# Patient Record
Sex: Male | Born: 1950
Health system: Southern US, Community
[De-identification: ages and names within clinical notes are randomized; demographics above are authoritative.]

## PROBLEM LIST (undated history)

## (undated) DIAGNOSIS — K4090 Unilateral inguinal hernia, without obstruction or gangrene, not specified as recurrent: Principal | ICD-10-CM

## (undated) DIAGNOSIS — G4730 Sleep apnea, unspecified: Secondary | ICD-10-CM

## (undated) DIAGNOSIS — I639 Cerebral infarction, unspecified: Secondary | ICD-10-CM

## (undated) DIAGNOSIS — K8689 Other specified diseases of pancreas: Secondary | ICD-10-CM

## (undated) DIAGNOSIS — C801 Malignant (primary) neoplasm, unspecified: Secondary | ICD-10-CM

## (undated) HISTORY — DX: Other specified diseases of pancreas: K86.89

## (undated) HISTORY — PX: TONSILLECTOMY: SUR1361

## (undated) HISTORY — DX: Unilateral inguinal hernia, without obstruction or gangrene, not specified as recurrent: K40.90

## (undated) HISTORY — PX: HERNIA REPAIR: SHX51

## (undated) HISTORY — PX: HAND SURGERY: SHX662

## (undated) HISTORY — PX: BRAIN SURGERY: SHX531

## (undated) HISTORY — DX: Cerebral infarction, unspecified: I63.9

---

## 2006-03-24 ENCOUNTER — Ambulatory Visit: Payer: Self-pay | Admitting: Unknown Physician Specialty

## 2012-07-16 ENCOUNTER — Encounter (INDEPENDENT_AMBULATORY_CARE_PROVIDER_SITE_OTHER): Payer: Self-pay | Admitting: Surgery

## 2012-07-16 ENCOUNTER — Ambulatory Visit (INDEPENDENT_AMBULATORY_CARE_PROVIDER_SITE_OTHER): Payer: 59 | Admitting: Surgery

## 2012-07-16 VITALS — BP 160/88 | HR 61 | Temp 98.0°F | Ht 74.5 in | Wt 169.4 lb

## 2012-07-16 DIAGNOSIS — K4090 Unilateral inguinal hernia, without obstruction or gangrene, not specified as recurrent: Secondary | ICD-10-CM | POA: Insufficient documentation

## 2012-07-16 HISTORY — DX: Unilateral inguinal hernia, without obstruction or gangrene, not specified as recurrent: K40.90

## 2012-07-16 NOTE — Progress Notes (Signed)
NAME: Jeffrey Carr DOB: 24-Dec-1950 MRN: [id number]                                                                                      DATE: 07/16/2012  PCP: No primary provider on file. Referring Provider: No ref. provider found  IMPRESSION: Left inguinal hernia, reducible, direct likely. PLAN:   I discussed alternatives with the patient and recommended that he consider repair. We discussed alternatives for repair and the use of mesh. He is interested in trying a truss as he is currently using a girdle. I told her that would be okay but I didn't think that was going to do anything other than delay his eventual surgery.                 CC:  Chief Complaint  Patient presents with  . Inguinal Hernia    Left    HPI:  Jeffrey Carr is a 61 y.o.  male who presents for evaluation of A left inguinal hernia. He's noticed it several weeks ago after catching himself when he was falling. Is not currently symptomatic in terms of pain. The bulge appears When he stands up and goes away when he lies down. He's never had a hernia before.  PMH:  has a past medical history of Left inguinal hernia (07/16/2012).  PSH:   has past surgical history that includes Tonsillectomy.  ALLERGIES:  No Known Allergies  MEDICATIONS: No current outpatient prescriptions on file.  ROS: He has filled out our 12 point review of systems and it is negative. EXAM:   VITAL SIGNS: BP 160/88  Pulse 61  Temp 98 F (36.7 C) (Temporal)  Ht 6' 2.5" (1.892 m)  Wt 169 lb 6.4 oz (76.839 kg)  BMI 21.46 kg/m2  SpO2 97%  GENERAL:  The patient is alert, oriented, and generally healthy-appearing, NAD. Mood and affect are normal.  HEENT:  The head is normocephalic, the eyes nonicteric, the pupils were round regular and equal. EOMs are normal. Pharynx normal. Dentition good.  NECK:  The neck is supple and there are no masses or thyromegaly.  LUNGS:  Normal respirations and clear to auscultation.  HEART:  Regular  rhythm, with no murmurs rubs or gallops. Pulses are intact carotid dorsalis pedis and posterior tibial. No significant varicosities are noted.  ABDOMEN:  Soft, flat, and nontender. No masses or organomegaly is noted. No hernias are noted. Bowel sounds are normal.  GU:  The testes appear retracted. There is no mass. He has a visible left Inguinalhernia which appears to be in the inguinal canal and not coming into the scrotum. There is no right inguinal hernia present. The hernia reduces easily. It is nontender.  EXTREMITIES:  Good range of motion, no edema.   DATA REVIEWED:  None    Dalonda Simoni J 07/16/2012  CC: No ref. provider found, No primary provider on file.

## 2012-07-16 NOTE — Patient Instructions (Signed)
We will schedule outpatient surgery to repair your left inguinal hernia. If you have any questions before the surgery please call the [phone]

## 2012-07-25 ENCOUNTER — Encounter (HOSPITAL_BASED_OUTPATIENT_CLINIC_OR_DEPARTMENT_OTHER): Payer: Self-pay | Admitting: *Deleted

## 2012-07-25 NOTE — Progress Notes (Addendum)
Requested sleep study results from [facility]. Jeffrey Carr has no records. Contacted patient - Dr. Sullivan Lone in [place] should have records. Called Dr. Elisabeth Cara office. Had sleep study and will fax.  Results received from Dr. Elisabeth Cara office and placed on chart.

## 2012-07-31 ENCOUNTER — Encounter (HOSPITAL_BASED_OUTPATIENT_CLINIC_OR_DEPARTMENT_OTHER): Payer: Self-pay | Admitting: Anesthesiology

## 2012-07-31 ENCOUNTER — Ambulatory Visit (HOSPITAL_BASED_OUTPATIENT_CLINIC_OR_DEPARTMENT_OTHER): Payer: 59 | Admitting: Anesthesiology

## 2012-07-31 ENCOUNTER — Ambulatory Visit (HOSPITAL_BASED_OUTPATIENT_CLINIC_OR_DEPARTMENT_OTHER)
Admission: RE | Admit: 2012-07-31 | Discharge: 2012-07-31 | Disposition: A | Discharge status: Home / Self Care | Payer: 59 | Source: Ambulatory Visit | Attending: Surgery | Admitting: Surgery

## 2012-07-31 ENCOUNTER — Encounter (HOSPITAL_BASED_OUTPATIENT_CLINIC_OR_DEPARTMENT_OTHER): Payer: Self-pay | Admitting: *Deleted

## 2012-07-31 ENCOUNTER — Encounter (HOSPITAL_BASED_OUTPATIENT_CLINIC_OR_DEPARTMENT_OTHER)
Admission: RE | Disposition: A | Discharge status: Home / Self Care | Payer: Self-pay | Source: Ambulatory Visit | Attending: Surgery

## 2012-07-31 DIAGNOSIS — G4730 Sleep apnea, unspecified: Secondary | ICD-10-CM | POA: Insufficient documentation

## 2012-07-31 DIAGNOSIS — K4090 Unilateral inguinal hernia, without obstruction or gangrene, not specified as recurrent: Secondary | ICD-10-CM

## 2012-07-31 DIAGNOSIS — K4020 Bilateral inguinal hernia, without obstruction or gangrene, not specified as recurrent: Secondary | ICD-10-CM | POA: Insufficient documentation

## 2012-07-31 HISTORY — PX: INGUINAL HERNIA REPAIR: SHX194

## 2012-07-31 HISTORY — DX: Sleep apnea, unspecified: G47.30

## 2012-07-31 SURGERY — REPAIR, HERNIA, INGUINAL, ADULT
Anesthesia: General | Site: Groin | Laterality: Left | Wound class: Clean

## 2012-07-31 MED ORDER — ONDANSETRON HCL 4 MG/2ML IJ SOLN
INTRAMUSCULAR | Status: DC | PRN
  Administered 2012-07-31: 4 mg via INTRAVENOUS

## 2012-07-31 MED ORDER — HYDROMORPHONE HCL PF 1 MG/ML IJ SOLN: 0.2500 mg | INTRAMUSCULAR | Status: DC | PRN

## 2012-07-31 MED ORDER — OXYCODONE HCL 5 MG/5ML PO SOLN: 5.0000 mg | Freq: Once | ORAL | Status: DC | PRN

## 2012-07-31 MED ORDER — SCOPOLAMINE 1 MG/3DAYS TD PT72
1.0000 | MEDICATED_PATCH | Freq: Once | TRANSDERMAL | Status: DC
  Administered 2012-07-31: 1.5 mg via TRANSDERMAL

## 2012-07-31 MED ORDER — ACETAMINOPHEN 10 MG/ML IV SOLN
1000.0000 mg | Freq: Once | INTRAVENOUS | Status: AC
  Administered 2012-07-31: 1000 mg via INTRAVENOUS

## 2012-07-31 MED ORDER — MIDAZOLAM HCL 2 MG/2ML IJ SOLN
1.0000 mg | INTRAMUSCULAR | Status: DC | PRN
  Administered 2012-07-31: 2 mg via INTRAVENOUS

## 2012-07-31 MED ORDER — OXYCODONE-ACETAMINOPHEN 5-325 MG PO TABS: 1.0000 | ORAL_TABLET | ORAL | Status: AC | PRN

## 2012-07-31 MED ORDER — OXYCODONE HCL 5 MG PO TABS: 5.0000 mg | ORAL_TABLET | Freq: Once | ORAL | Status: DC | PRN

## 2012-07-31 MED ORDER — FENTANYL CITRATE 0.05 MG/ML IJ SOLN
50.0000 ug | INTRAMUSCULAR | Status: DC | PRN
  Administered 2012-07-31: 100 ug via INTRAVENOUS

## 2012-07-31 MED ORDER — LACTATED RINGERS IV SOLN
INTRAVENOUS | Status: DC
  Administered 2012-07-31: 08:00:00 via INTRAVENOUS

## 2012-07-31 MED ORDER — METOCLOPRAMIDE HCL 5 MG/ML IJ SOLN: 10.0000 mg | Freq: Once | INTRAMUSCULAR | Status: DC | PRN

## 2012-07-31 MED ORDER — PROPOFOL 10 MG/ML IV BOLUS
INTRAVENOUS | Status: DC | PRN
  Administered 2012-07-31: 250 mg via INTRAVENOUS

## 2012-07-31 MED ORDER — CEFAZOLIN SODIUM-DEXTROSE 2-3 GM-% IV SOLR
2.0000 g | INTRAVENOUS | Status: AC
  Administered 2012-07-31: 2 g via INTRAVENOUS

## 2012-07-31 MED ORDER — CHLORHEXIDINE GLUCONATE 4 % EX LIQD: 1.0000 "application " | Freq: Once | CUTANEOUS | Status: DC

## 2012-07-31 MED ORDER — BUPIVACAINE HCL (PF) 0.25 % IJ SOLN
INTRAMUSCULAR | Status: DC | PRN
  Administered 2012-07-31: 20 mL

## 2012-07-31 MED ORDER — BUPIVACAINE HCL (PF) 0.5 % IJ SOLN
INTRAMUSCULAR | Status: DC | PRN
  Administered 2012-07-31: 25 mL

## 2012-07-31 MED ORDER — DEXAMETHASONE SODIUM PHOSPHATE 4 MG/ML IJ SOLN
INTRAMUSCULAR | Status: DC | PRN
  Administered 2012-07-31: 10 mg via INTRAVENOUS

## 2012-07-31 MED ORDER — LIDOCAINE HCL (CARDIAC) 20 MG/ML IV SOLN
INTRAVENOUS | Status: DC | PRN
  Administered 2012-07-31: 50 mg via INTRAVENOUS

## 2012-07-31 MED ORDER — METOCLOPRAMIDE HCL 5 MG/ML IJ SOLN
INTRAMUSCULAR | Status: DC | PRN
  Administered 2012-07-31: 10 mg via INTRAVENOUS

## 2012-07-31 SURGICAL SUPPLY — 41 items
BLADE HEX COATED 2.75 (ELECTRODE) ×2 IMPLANT
BLADE SURG 10 STRL SS (BLADE) ×2 IMPLANT
BLADE SURG ROTATE 9660 (MISCELLANEOUS) ×2 IMPLANT
CHLORAPREP W/TINT 26ML (MISCELLANEOUS) ×2 IMPLANT
CLIP TI WIDE RED SMALL 6 (CLIP) IMPLANT
CLOTH BEACON ORANGE TIMEOUT ST (SAFETY) ×2 IMPLANT
COVER MAYO STAND STRL (DRAPES) ×2 IMPLANT
COVER TABLE BACK 60X90 (DRAPES) ×2 IMPLANT
DECANTER SPIKE VIAL GLASS SM (MISCELLANEOUS) IMPLANT
DERMABOND ADVANCED (GAUZE/BANDAGES/DRESSINGS) ×1
DERMABOND ADVANCED .7 DNX12 (GAUZE/BANDAGES/DRESSINGS) ×1 IMPLANT
DRAIN PENROSE 1/2X12 LTX STRL (WOUND CARE) ×2 IMPLANT
DRAPE LAPAROTOMY TRNSV 102X78 (DRAPE) ×2 IMPLANT
DRAPE UTILITY XL STRL (DRAPES) ×2 IMPLANT
ELECT REM PT RETURN 9FT ADLT (ELECTROSURGICAL) ×2
ELECTRODE REM PT RTRN 9FT ADLT (ELECTROSURGICAL) ×1 IMPLANT
GLOVE BIOGEL PI IND STRL 7.0 (GLOVE) ×1 IMPLANT
GLOVE BIOGEL PI INDICATOR 7.0 (GLOVE) ×1
GLOVE ECLIPSE 6.5 STRL STRAW (GLOVE) ×2 IMPLANT
GLOVE EUDERMIC 7 POWDERFREE (GLOVE) ×2 IMPLANT
GOWN PREVENTION PLUS XLARGE (GOWN DISPOSABLE) ×4 IMPLANT
MESH HERNIA 3X6 (Mesh General) ×2 IMPLANT
NEEDLE HYPO 22GX1.5 SAFETY (NEEDLE) ×2 IMPLANT
NEEDLE HYPO 25X1 1.5 SAFETY (NEEDLE) ×2 IMPLANT
NS IRRIG 1000ML POUR BTL (IV SOLUTION) ×2 IMPLANT
PACK BASIN DAY SURGERY FS (CUSTOM PROCEDURE TRAY) ×2 IMPLANT
PENCIL BUTTON HOLSTER BLD 10FT (ELECTRODE) ×2 IMPLANT
SLEEVE SCD COMPRESS KNEE MED (MISCELLANEOUS) ×2 IMPLANT
SPONGE INTESTINAL PEANUT (DISPOSABLE) ×2 IMPLANT
SPONGE LAP 4X18 X RAY DECT (DISPOSABLE) ×2 IMPLANT
SUT MNCRL AB 4-0 PS2 18 (SUTURE) ×2 IMPLANT
SUT PROLENE 2 0 CT2 30 (SUTURE) ×4 IMPLANT
SUT SILK 2 0 SH (SUTURE) IMPLANT
SUT VIC AB 3-0 CT1 27 (SUTURE) ×2
SUT VIC AB 3-0 CT1 27XBRD (SUTURE) ×2 IMPLANT
SUT VIC AB 4-0 BRD 54 (SUTURE) ×2 IMPLANT
SYR CONTROL 10ML LL (SYRINGE) ×2 IMPLANT
TAPE HYPAFIX 4 X10 (GAUZE/BANDAGES/DRESSINGS) IMPLANT
TOWEL OR 17X24 6PK STRL BLUE (TOWEL DISPOSABLE) IMPLANT
TOWEL OR NON WOVEN STRL DISP B (DISPOSABLE) ×2 IMPLANT
WATER STERILE IRR 1000ML POUR (IV SOLUTION) IMPLANT

## 2012-07-31 NOTE — Anesthesia Procedure Notes (Addendum)
Anesthesia Regional Block:  TAP block  Pre-Anesthetic Checklist: ,, timeout performed, Correct Patient, Correct Site, Correct Laterality, Correct Procedure, Correct Position, site marked, Risks and benefits discussed,  Surgical consent,  Pre-op evaluation,  At surgeon's request and post-op pain management  Laterality: Left  Prep: chloraprep       Needles:  Injection technique: Single-shot  Needle Type: Echogenic Needle     Needle Length: 9cm  Needle Gauge: 21    Additional Needles:  Procedures: ultrasound guided TAP block Narrative:  Start time: 07/31/2012 8:01 AM End time: 07/31/2012 8:09 AM Injection made incrementally with aspirations every 5 mL.  Performed by: Personally  Anesthesiologist: Aldona Lento, MD  Additional Notes: Ultrasound guidance used to: id relevant anatomy, confirm needle position, local anesthetic spread, avoidance of vascular puncture. Picture saved. No complications. Block performed personally by Janetta Hora. Gelene Mink, MD    TAP block Procedure Name: LMA Insertion Performed by: York Grice Pre-anesthesia Checklist: Patient identified, Emergency Drugs available, Suction available and Patient being monitored Patient Re-evaluated:Patient Re-evaluated prior to inductionOxygen Delivery Method: Circle system utilized Preoxygenation: Pre-oxygenation with 100% oxygen Intubation Type: IV induction Ventilation: Mask ventilation without difficulty LMA: LMA inserted LMA Size: 4.0 Number of attempts: 1 Placement Confirmation: breath sounds checked- equal and bilateral and positive ETCO2 Tube secured with: Tape Dental Injury: Teeth and Oropharynx as per pre-operative assessment

## 2012-07-31 NOTE — Progress Notes (Signed)
Assisted Dr. Frederick with left, ultrasound guided, transabdominal plane block. Side rails up, monitors on throughout procedure. See vital signs in flow sheet. Tolerated Procedure well. 

## 2012-07-31 NOTE — Interval H&P Note (Signed)
History and Physical Interval Note:  07/31/2012 8:20 AM  Jeffrey Carr  has presented today for surgery, with the diagnosis of Left inguinal hernia  The various methods of treatment have been discussed with the patient and family. After consideration of risks, benefits and other options for treatment, the patient has consented to  Procedure(s) (LRB): HERNIA REPAIR INGUINAL ADULT (Left) as a surgical intervention .  The patient's history has been reviewed, patient examined, no change in status, stable for surgery.  I have reviewed the patient's chart and labs.  Questions were answered to the patient's satisfaction.     Azarias Chiou J

## 2012-07-31 NOTE — Anesthesia Preprocedure Evaluation (Signed)
Anesthesia Evaluation  Patient identified by MRN, date of birth, ID band Patient awake    Reviewed: Allergy & Precautions, H&P , NPO status , Patient's Chart, lab work & pertinent test results, reviewed documented beta blocker date and time   Airway Mallampati: II TM Distance: >3 FB Neck ROM: full    Dental   Pulmonary sleep apnea ,  breath sounds clear to auscultation        Cardiovascular negative cardio ROS  Rhythm:regular     Neuro/Psych negative neurological ROS  negative psych ROS   GI/Hepatic negative GI ROS, Neg liver ROS,   Endo/Other  negative endocrine ROS  Renal/GU negative Renal ROS  negative genitourinary   Musculoskeletal   Abdominal   Peds  Hematology negative hematology ROS (+)   Anesthesia Other Findings See surgeon's H&P   Reproductive/Obstetrics negative OB ROS                           Anesthesia Physical Anesthesia Plan  ASA: II  Anesthesia Plan: General   Post-op Pain Management:    Induction: Intravenous  Airway Management Planned: LMA  Additional Equipment:   Intra-op Plan:   Post-operative Plan: Extubation in OR  Informed Consent: I have reviewed the patients History and Physical, chart, labs and discussed the procedure including the risks, benefits and alternatives for the proposed anesthesia with the patient or authorized representative who has indicated his/her understanding and acceptance.   Dental Advisory Given  Plan Discussed with: CRNA and Surgeon  Anesthesia Plan Comments:         Anesthesia Quick Evaluation  

## 2012-07-31 NOTE — Op Note (Signed)
Jeffrey Carr 1951-06-13 [id number] 07/16/2012  Preoperative diagnosis: direct left inguinal hernia  Postoperative diagnosis: same  Procedure: repair left inguinal hernia with mesh  Surgeon: Currie Paris, MD, FACS  Anesthesia:General  Clinical History and Indications: patient has presented with a left inguinal hernia which he wished to have repaired.  Description of Procedure:The patient was seen in the holding area and the plans for the procedure as noted above confirmed with the patient. We reviewed again the risks and complications and the patient had no further questions. I then marked the left  as the operative side. This was confirmed with the patient.  The patient was taken to the operating room and after satisfactory Gen. anesthesia was obtained the left  inguinal area was prepped and draped as a sterile field. A time out was done.  I used 0.25% plain Marcaine to help with postoperative pain management. The area of the incision was infiltrated. l of the   An oblique incision was made and deepened to the external oblique aponeurosis. Bleeders were either cauterized or tied with 3-0 Vicryl. The external oblique aponeurosis was opened in the line of its fibers and elevated off of the underlying tissue. The ilioinguinal nerve was noted and protected.  The spermatic cord was then dissected up off of the inguinal floor and surrounded with a drain. There is no indirect sac present. There is a diffuse defect in the inguinal floor consistent with a direct left inguinal hernia.  I then took some Marlex mesh and cut it to shape. It was anchored at the pubic tubercle and a running 2-0 Prolene used to suture it to the edge of the inferior shelving edge of the external oblique. It was split laterally to go around the cord and then laid gently over the internal oblique medially. Several more sutures of 2-0 Prolene were used to anchor the mesh to the internal oblique. The tails of the mesh  were crossed lateral to the cord structures and deep ring and tacked together.  This appeared to produce a nice coverage and repair with no tension. There was adequate space for the cord structures to exit through the mesh and deep ring.  I checked to make sure everything was dry. Additional local had been infiltrated as I was working to be sure we had complete anesthesia of the entire operative field.  The incision was then closed with a running 3-0 Vicryl on the external oblique, closing it over the repair. Scarpa's fascia was closed with a running 3-0 Vicryl and the skin with a running 4-0 Monocryl subcuticular and Dermabond on the skin.  The patient tolerated the procedure well. There were no operative complications. There was minimal blood loss. All counts were correct.  Currie Paris, MD, FACS 07/31/2012 9:36 AM

## 2012-07-31 NOTE — Transfer of Care (Signed)
Immediate Anesthesia Transfer of Care Note  Patient: Jeffrey Carr  Procedure(s) Performed: Procedure(s) (LRB): HERNIA REPAIR INGUINAL ADULT (Left)  Patient Location: PACU  Anesthesia Type: General  Level of Consciousness: awake and sedated  Airway & Oxygen Therapy: Patient Spontanous Breathing and Patient connected to face mask oxygen  Post-op Assessment: Report given to PACU RN and Post -op Vital signs reviewed and stable  Post vital signs: Reviewed and stable  Complications: No apparent anesthesia complications

## 2012-07-31 NOTE — H&P (View-Only) (Signed)
NAME: Jeffrey Carr DOB: 06/24/1951 MRN: [id number]                                                                                      DATE: 07/16/2012  PCP: No primary provider on file. Referring Provider: No ref. provider found  IMPRESSION: Left inguinal hernia, reducible, direct likely. PLAN:   I discussed alternatives with the patient and recommended that he consider repair. We discussed alternatives for repair and the use of mesh. He is interested in trying a truss as he is currently using a girdle. I told her that would be okay but I didn't think that was going to do anything other than delay his eventual surgery.                 CC:  Chief Complaint  Patient presents with  . Inguinal Hernia    Left    HPI:  Jeffrey Carr is a 61 y.o.  male who presents for evaluation of A left inguinal hernia. He's noticed it several weeks ago after catching himself when he was falling. Is not currently symptomatic in terms of pain. The bulge appears When he stands up and goes away when he lies down. He's never had a hernia before.  PMH:  has a past medical history of Left inguinal hernia (07/16/2012).  PSH:   has past surgical history that includes Tonsillectomy.  ALLERGIES:  No Known Allergies  MEDICATIONS: No current outpatient prescriptions on file.  ROS: He has filled out our 12 point review of systems and it is negative. EXAM:   VITAL SIGNS: BP 160/88  Pulse 61  Temp 98 F (36.7 C) (Temporal)  Ht 6' 2.5" (1.892 m)  Wt 169 lb 6.4 oz (76.839 kg)  BMI 21.46 kg/m2  SpO2 97%  GENERAL:  The patient is alert, oriented, and generally healthy-appearing, NAD. Mood and affect are normal.  HEENT:  The head is normocephalic, the eyes nonicteric, the pupils were round regular and equal. EOMs are normal. Pharynx normal. Dentition good.  NECK:  The neck is supple and there are no masses or thyromegaly.  LUNGS:  Normal respirations and clear to auscultation.  HEART:  Regular  rhythm, with no murmurs rubs or gallops. Pulses are intact carotid dorsalis pedis and posterior tibial. No significant varicosities are noted.  ABDOMEN:  Soft, flat, and nontender. No masses or organomegaly is noted. No hernias are noted. Bowel sounds are normal.  GU:  The testes appear retracted. There is no mass. He has a visible left Inguinalhernia which appears to be in the inguinal canal and not coming into the scrotum. There is no right inguinal hernia present. The hernia reduces easily. It is nontender.  EXTREMITIES:  Good range of motion, no edema.   DATA REVIEWED:  None    [name] 07/16/2012  CC: No ref. provider found, No primary provider on file.        

## 2012-07-31 NOTE — Anesthesia Postprocedure Evaluation (Signed)
Anesthesia Post Note  Patient: Jeffrey Carr  Procedure(s) Performed: Procedure(s) (LRB): HERNIA REPAIR INGUINAL ADULT (Left)  Anesthesia type: General  Patient location: PACU  Post pain: Pain level controlled  Post assessment: Patient's Cardiovascular Status Stable  Last Vitals:  Filed Vitals:   07/31/12 1100  BP: 158/75  Pulse: 53  Temp: 36.5 C  Resp: 16    Post vital signs: Reviewed and stable  Level of consciousness: alert  Complications: No apparent anesthesia complications

## 2012-08-01 ENCOUNTER — Encounter (HOSPITAL_BASED_OUTPATIENT_CLINIC_OR_DEPARTMENT_OTHER): Payer: Self-pay | Admitting: Surgery

## 2012-08-01 LAB — POCT HEMOGLOBIN-HEMACUE: Hemoglobin: 13.8 g/dL (ref 13.0–17.0)

## 2012-08-24 ENCOUNTER — Encounter (INDEPENDENT_AMBULATORY_CARE_PROVIDER_SITE_OTHER): Payer: Self-pay | Admitting: Surgery

## 2012-08-24 ENCOUNTER — Ambulatory Visit (INDEPENDENT_AMBULATORY_CARE_PROVIDER_SITE_OTHER): Payer: 59 | Admitting: Surgery

## 2012-08-24 VITALS — BP 128/76 | HR 72 | Temp 98.1°F | Resp 16 | Ht 74.5 in | Wt 173.8 lb

## 2012-08-24 DIAGNOSIS — K4090 Unilateral inguinal hernia, without obstruction or gangrene, not specified as recurrent: Secondary | ICD-10-CM

## 2012-08-24 NOTE — Patient Instructions (Addendum)
You may return to "light Duty" Monday, but no strenuos activity or lifting more than 15 pounds until you are six weeks fromo surgery.  We will see you again on an as needed basis. Please call the office at [phone] if you have any questions or concerns. Thank you for allowing [place] to take care of you.

## 2012-08-24 NOTE — Progress Notes (Signed)
NAME: Jeffrey Carr                                            DOB: [birth year] DATE: 08/24/2012                                                  MRN: [id number]  CC: Post op   HPI: This patient comes in for post op follow-up .Heunderwent repair of a LIH on 07/31/12. He feels that he is doing well.  PE:  VITAL SIGNS: BP 128/76  Pulse 72  Temp 98.1 F (36.7 C) (Temporal)  Resp 16  Ht 6' 2.5" (1.892 m)  Wt 173 lb 12.8 oz (78.835 kg)  BMI 22.02 kg/m2  General: The patient appears to be healthy, NAD Incision healing nicely, repair solid=  IMPRESSION: The patient is doing well S/P [facility] repair.    PLAN: RTC PRN Light duty at work until six weeks post op

## 2014-06-30 DIAGNOSIS — E785 Hyperlipidemia, unspecified: Secondary | ICD-10-CM | POA: Insufficient documentation

## 2014-06-30 DIAGNOSIS — E349 Endocrine disorder, unspecified: Secondary | ICD-10-CM | POA: Insufficient documentation

## 2015-11-08 ENCOUNTER — Emergency Department
Admission: EM | Admit: 2015-11-08 | Discharge: 2015-11-08 | Disposition: A | Discharge status: Other Acute Inpatient Hospital | Payer: PPO | Attending: Emergency Medicine | Admitting: Emergency Medicine

## 2015-11-08 ENCOUNTER — Emergency Department: Payer: PPO

## 2015-11-08 DIAGNOSIS — I614 Nontraumatic intracerebral hemorrhage in cerebellum: Secondary | ICD-10-CM | POA: Insufficient documentation

## 2015-11-08 DIAGNOSIS — I629 Nontraumatic intracranial hemorrhage, unspecified: Secondary | ICD-10-CM

## 2015-11-08 DIAGNOSIS — I619 Nontraumatic intracerebral hemorrhage, unspecified: Secondary | ICD-10-CM | POA: Diagnosis not present

## 2015-11-08 DIAGNOSIS — R1110 Vomiting, unspecified: Secondary | ICD-10-CM | POA: Diagnosis present

## 2015-11-08 LAB — COMPREHENSIVE METABOLIC PANEL
ALT: 20 U/L (ref 17–63)
ANION GAP: 8 (ref 5–15)
AST: 22 U/L (ref 15–41)
Albumin: 4.1 g/dL (ref 3.5–5.0)
Alkaline Phosphatase: 77 U/L (ref 38–126)
BILIRUBIN TOTAL: 0.9 mg/dL (ref 0.3–1.2)
BUN: 14 mg/dL (ref 6–20)
CALCIUM: 8.9 mg/dL (ref 8.9–10.3)
CO2: 26 mmol/L (ref 22–32)
Chloride: 104 mmol/L (ref 101–111)
Creatinine, Ser: 0.88 mg/dL (ref 0.61–1.24)
GFR calc Af Amer: >60 mL/min (ref >60)
Glucose, Bld: 146 mg/dL — ABNORMAL HIGH (ref 65–99)
POTASSIUM: 4 mmol/L (ref 3.5–5.1)
Sodium: 138 mmol/L (ref 135–145)
TOTAL PROTEIN: 7.2 g/dL (ref 6.5–8.1)

## 2015-11-08 LAB — DIFFERENTIAL
Basophils Absolute: 0.1 10*3/uL (ref 0–0.1)
Basophils Relative: 0 %
EOS ABS: 0 10*3/uL (ref 0–0.7)
EOS PCT: 0 %
LYMPHS ABS: 1 10*3/uL (ref 1.0–3.6)
Lymphocytes Relative: 7 %
MONO ABS: 0.7 10*3/uL (ref 0.2–1.0)
MONOS PCT: 5 %
NEUTROS PCT: 88 %
Neutro Abs: 13.9 10*3/uL — ABNORMAL HIGH (ref 1.4–6.5)

## 2015-11-08 LAB — TROPONIN I

## 2015-11-08 LAB — CBC
HEMATOCRIT: 45.7 % (ref 40.0–52.0)
HEMOGLOBIN: 15.6 g/dL (ref 13.0–18.0)
MCH: 29.7 pg (ref 26.0–34.0)
MCHC: 34.1 g/dL (ref 32.0–36.0)
MCV: 87.1 fL (ref 80.0–100.0)
Platelets: 168 10*3/uL (ref 150–440)
RBC: 5.25 MIL/uL (ref 4.40–5.90)
RDW: 14.3 % (ref 11.5–14.5)
WBC: 15.8 10*3/uL — ABNORMAL HIGH (ref 3.8–10.6)

## 2015-11-08 LAB — APTT: aPTT: 28 seconds (ref 24–36)

## 2015-11-08 LAB — PROTIME-INR
INR: 0.92
PROTHROMBIN TIME: 12.6 s (ref 11.4–15.0)

## 2015-11-08 NOTE — ED Provider Notes (Signed)
[facility] Emergency Department Provider Note   ____________________________________________  Time seen: 1414  I have reviewed the triage vital signs and the nursing notes.   HISTORY  Chief Complaint Emesis and Aphasia   History limited by: Slurred speech, some history obtained from family   HPI Jeffrey Carr is a 64 y.o. male who comes to the hospital accompanied by family today because of concerns for slurred speech, difficulty with walking, emesis and headache. The symptoms started almost 24 hours ago. Family states the patient was complaining of a severe headache. The patient does indicate that it was on the left side of his head yesterday. The patient then started developing some slurred speech which has gotten progressively worse. Furthermore the patient has had multiple episodes of emesis. The patient states he also feels like he has had a hard time walking. He has not had any traumatic injuries to his brain. The patient states that he is not on any blood thinners.   Past Medical History  Diagnosis Date  . Left inguinal hernia 07/16/2012  . Sleep apnea 5-6 yrs ago    CPAP recommended-pt has but does not use.    There are no active problems to display for this patient.   Past Surgical History  Procedure Laterality Date  . Tonsillectomy    . Hand surgery  12 years ago  . Inguinal hernia repair  07/31/2012    Procedure: HERNIA REPAIR INGUINAL ADULT;  Surgeon: Haywood Lasso, MD;  Location: [facility];  Service: General;  Laterality: Left;  . Hernia repair      No current outpatient prescriptions on file.  Allergies Review of patient's allergies indicates no known allergies.  Family History  Problem Relation Age of Onset  . Cancer Father     stomach    Social History Social History  Substance Use Topics  . Smoking status: Never Smoker   . Smokeless tobacco: Never Used  . Alcohol Use: No     Comment: rare     Review of Systems  Constitutional: Negative for fever. Cardiovascular: Negative for chest pain. Respiratory: Negative for shortness of breath. Gastrointestinal: Negative for abdominal pain. Positive for vomiting Genitourinary: Negative for dysuria. Musculoskeletal: Negative for back pain. Skin: Negative for rash. Neurological: Positive for headache, slurred speech, difficulty with walking.   10-point ROS otherwise negative.  ____________________________________________   PHYSICAL EXAM:  VITAL SIGNS: ED Triage Vitals  Enc Vitals Group     BP 11/08/15 1357 173/77 mmHg     Pulse Rate 11/08/15 1357 81     Resp 11/08/15 1357 18     Temp 11/08/15 1357 98.2 F (36.8 C)     Temp Source 11/08/15 1357 Oral     SpO2 11/08/15 1357 93 %     Weight 11/08/15 1357 180 lb (81.647 kg)   Constitutional: Awake and alert. Slurred speech. Moves all extremities. Eyes: Conjunctivae are normal. PERRL. Normal extraocular movements. ENT   Head: Normocephalic and atraumatic.   Nose: No congestion/rhinnorhea.   Mouth/Throat: Mucous membranes are moist.   Neck: No stridor. Hematological/Lymphatic/Immunilogical: No cervical lymphadenopathy. Cardiovascular: Normal rate, regular rhythm.  No murmurs, rubs, or gallops. Respiratory: Normal respiratory effort without tachypnea nor retractions. Breath sounds are clear and equal bilaterally. No wheezes/rales/rhonchi. Gastrointestinal: Soft and nontender. No distention.  Genitourinary: Deferred Musculoskeletal: Normal range of motion in all extremities. No joint effusions.  No lower extremity tenderness nor edema. Neurologic:  Slurred speech. Tongue midline. Face symmetric. Awake and alert.  Oriented. Strength 5 out of 5 in upper and lower extremities. Finger to nose slightly worse in the right arm. No nystagmus. Skin:  Skin is warm, dry and intact. No rash noted. Psychiatric: Mood and affect are normal. Speech and behavior are normal. Patient  exhibits appropriate insight and judgment.  ____________________________________________    LABS (pertinent positives/negatives)  Labs Reviewed  CBC - Abnormal; Notable for the following:    WBC 15.8 (*)    All other components within normal limits  DIFFERENTIAL - Abnormal; Notable for the following:    Neutro Abs 13.9 (*)    All other components within normal limits  COMPREHENSIVE METABOLIC PANEL - Abnormal; Notable for the following:    Glucose, Bld 146 (*)    All other components within normal limits  PROTIME-INR  APTT  TROPONIN I    ____________________________________________   EKG I, Nance Pear, attending physician, personally viewed and interpreted this EKG  EKG Time: 1404 Rate: 69 Rhythm: NSR Axis: normal Intervals: qtc 469 QRS: narrow ST changes: no st elevation Impression: normal ekg   ____________________________________________    RADIOLOGY  CT head IMPRESSION: 1. Large acute hemorrhage/hematoma involving the superior cerebellum, the largest component in the midline but extending into both superior cerebellar hemispheres. Hypertensive hemorrhage versus hemorrhage due to coagulopathy is the likely etiology. 2. Small focus of acute hemorrhage in the anterior left side of the pons. 3. Mass effect with effacement of the cerebellar folia diffusely and likely incipient transtentorial herniation. 4. Associated small tentorial subdural hematoma. 5. No evidence of intraventricular hemorrhage. No convincing associated subarachnoid hemorrhage.  I, Nance Pear, personally discussed these images and results by phone with the on-call radiologist and used this discussion as part of my medical decision making.    ___________________________________________   PROCEDURES  Procedure(s) performed: None  Critical Care performed: Yes, see critical care note(s)  CRITICAL CARE Performed by: Nance Pear   Total critical care time: 45  minutes  Critical care time was exclusive of separately billable procedures and treating other patients.  Critical care was necessary to treat or prevent imminent or life-threatening deterioration.  Critical care was time spent personally by me on the following activities: development of treatment plan with patient and/or surrogate as well as nursing, discussions with consultants, evaluation of patient's response to treatment, examination of patient, obtaining history from patient or surrogate, ordering and performing treatments and interventions, ordering and review of laboratory studies, ordering and review of radiographic studies, pulse oximetry and re-evaluation of patient's condition.  ____________________________________________   INITIAL IMPRESSION / ASSESSMENT AND PLAN / ED COURSE  Pertinent labs & imaging results that were available during my care of the patient were reviewed by me and considered in my medical decision making (see chart for details).  Patient presented to the emergency department brought in by family because of concerns for slurred speech, difficulty walking vomiting and headache. CT scan did show an acute intracranial hemorrhage. Initial blood pressure was slightly elevated however repeat blood pressures were within a good range. On my exam patient does have clearly slurred speech. His strength is good in all extremities. Slightly decreased finger to nose and the right arm. I discussed with [facility] neurosurgery Dr. Seward Speck who has accepted him in transfer.  ----------------------------------------- 3:52 PM on 11/08/2015 -----------------------------------------  Repeat neurologic exam at this time is stable. Patient still with her speech however strength good in all extremities.  ____________________________________________   FINAL CLINICAL IMPRESSION(S) / ED DIAGNOSES  Final diagnoses:  Intracranial hemorrhage ([place])  Nance Pear, MD 11/08/15 [phone]

## 2015-11-08 NOTE — ED Notes (Signed)
MD at bedside. 

## 2015-11-08 NOTE — ED Notes (Signed)
[place] at bedside.

## 2015-11-08 NOTE — ED Notes (Signed)
Pt here for vomiting, slurred speech, and headache.

## 2015-11-08 NOTE — ED Notes (Signed)
Report to Madelynn Done, EMT-P with [facility]

## 2015-11-20 DIAGNOSIS — I10 Essential (primary) hypertension: Secondary | ICD-10-CM | POA: Insufficient documentation

## 2015-11-20 DIAGNOSIS — I629 Nontraumatic intracranial hemorrhage, unspecified: Secondary | ICD-10-CM | POA: Insufficient documentation

## 2015-12-10 DIAGNOSIS — D72829 Elevated white blood cell count, unspecified: Secondary | ICD-10-CM | POA: Diagnosis not present

## 2015-12-24 DIAGNOSIS — I10 Essential (primary) hypertension: Secondary | ICD-10-CM | POA: Diagnosis not present

## 2015-12-24 DIAGNOSIS — Q282 Arteriovenous malformation of cerebral vessels: Secondary | ICD-10-CM | POA: Diagnosis not present

## 2015-12-24 DIAGNOSIS — E785 Hyperlipidemia, unspecified: Secondary | ICD-10-CM | POA: Diagnosis not present

## 2015-12-25 DIAGNOSIS — Q282 Arteriovenous malformation of cerebral vessels: Secondary | ICD-10-CM | POA: Diagnosis not present

## 2016-01-01 DIAGNOSIS — Q282 Arteriovenous malformation of cerebral vessels: Secondary | ICD-10-CM | POA: Diagnosis not present

## 2016-02-24 ENCOUNTER — Encounter: Payer: Self-pay | Admitting: Emergency Medicine

## 2016-02-24 ENCOUNTER — Emergency Department
Admission: EM | Admit: 2016-02-24 | Discharge: 2016-02-25 | Disposition: A | Discharge status: Home / Self Care | Payer: PPO | Attending: Emergency Medicine | Admitting: Emergency Medicine

## 2016-02-24 ENCOUNTER — Emergency Department: Payer: PPO

## 2016-02-24 DIAGNOSIS — I629 Nontraumatic intracranial hemorrhage, unspecified: Secondary | ICD-10-CM | POA: Diagnosis not present

## 2016-02-24 DIAGNOSIS — S0990XA Unspecified injury of head, initial encounter: Secondary | ICD-10-CM | POA: Diagnosis not present

## 2016-02-24 DIAGNOSIS — Y9289 Other specified places as the place of occurrence of the external cause: Secondary | ICD-10-CM | POA: Diagnosis not present

## 2016-02-24 DIAGNOSIS — I10 Essential (primary) hypertension: Secondary | ICD-10-CM | POA: Diagnosis not present

## 2016-02-24 DIAGNOSIS — Y9389 Activity, other specified: Secondary | ICD-10-CM | POA: Diagnosis not present

## 2016-02-24 DIAGNOSIS — E785 Hyperlipidemia, unspecified: Secondary | ICD-10-CM | POA: Diagnosis not present

## 2016-02-24 DIAGNOSIS — Y998 Other external cause status: Secondary | ICD-10-CM | POA: Diagnosis not present

## 2016-02-24 DIAGNOSIS — S0181XA Laceration without foreign body of other part of head, initial encounter: Secondary | ICD-10-CM | POA: Insufficient documentation

## 2016-02-24 DIAGNOSIS — W01198A Fall on same level from slipping, tripping and stumbling with subsequent striking against other object, initial encounter: Secondary | ICD-10-CM | POA: Insufficient documentation

## 2016-02-24 NOTE — ED Notes (Addendum)
Pt presents to ED for fall. Pt states he was playing with young child when slipped and fell forward, hitting on hard floor. 5cm/1cm laceration noted on forehead. Pt denies LOC or headache. Reports bilateral knee pain. Denies taking blood thinner, Tdap updated about 2 years ago.

## 2016-02-24 NOTE — ED Notes (Signed)
Bleeding controlled.

## 2016-02-25 DIAGNOSIS — I10 Essential (primary) hypertension: Secondary | ICD-10-CM | POA: Diagnosis not present

## 2016-02-25 DIAGNOSIS — E785 Hyperlipidemia, unspecified: Secondary | ICD-10-CM | POA: Diagnosis not present

## 2016-02-25 DIAGNOSIS — I629 Nontraumatic intracranial hemorrhage, unspecified: Secondary | ICD-10-CM | POA: Diagnosis not present

## 2016-02-25 MED ORDER — LIDOCAINE HCL (PF) 1 % IJ SOLN
5.0000 mL | Freq: Once | INTRAMUSCULAR | Status: AC
  Administered 2016-02-25: 5 mL via INTRADERMAL

## 2016-02-25 MED ORDER — LIDOCAINE HCL (PF) 1 % IJ SOLN
INTRAMUSCULAR | Status: AC
  Administered 2016-02-25: 5 mL via INTRADERMAL
  Filled 2016-02-25: qty 5

## 2016-02-25 NOTE — ED Notes (Signed)
MD at bedside. 

## 2016-02-25 NOTE — Discharge Instructions (Signed)
Facial Laceration Please have your sutures removed in 7 days  A facial laceration is a cut on the face. These injuries can be painful and cause bleeding. Lacerations usually heal quickly, but they need special care to reduce scarring. DIAGNOSIS  Your health care provider will take a medical history, ask for details about how the injury occurred, and examine the wound to determine how deep the cut is. TREATMENT  Some facial lacerations may not require closure. Others may not be able to be closed because of an increased risk of infection. The risk of infection and the chance for successful closure will depend on various factors, including the amount of time since the injury occurred. The wound may be cleaned to help prevent infection. If closure is appropriate, pain medicines may be given if needed. Your health care provider will use stitches (sutures), wound glue (adhesive), or skin adhesive strips to repair the laceration. These tools bring the skin edges together to allow for faster healing and a better cosmetic outcome. If needed, you may also be given a tetanus shot. HOME CARE INSTRUCTIONS  Only take over-the-counter or prescription medicines as directed by your health care provider.  Follow your health care provider's instructions for wound care. These instructions will vary depending on the technique used for closing the wound. For Sutures:  Keep the wound clean and dry.   If you were given a bandage (dressing), you should change it at least once a day. Also change the dressing if it becomes wet or dirty, or as directed by your health care provider.   Wash the wound with soap and water 2 times a day. Rinse the wound off with water to remove all soap. Pat the wound dry with a clean towel.   After cleaning, apply a thin layer of the antibiotic ointment recommended by your health care provider. This will help prevent infection and keep the dressing from sticking.   You may shower as  usual after the first 24 hours. Do not soak the wound in water until the sutures are removed.   Get your sutures removed as directed by your health care provider. With facial lacerations, sutures should usually be taken out after 4-5 days to avoid stitch marks.   Wait a few days after your sutures are removed before applying any makeup. For Skin Adhesive Strips:  Keep the wound clean and dry.   Do not get the skin adhesive strips wet. You may bathe carefully, using caution to keep the wound dry.   If the wound gets wet, pat it dry with a clean towel.   Skin adhesive strips will fall off on their own. You may trim the strips as the wound heals. Do not remove skin adhesive strips that are still stuck to the wound. They will fall off in time.  For Wound Adhesive:  You may briefly wet your wound in the shower or bath. Do not soak or scrub the wound. Do not swim. Avoid periods of heavy sweating until the skin adhesive has fallen off on its own. After showering or bathing, gently pat the wound dry with a clean towel.   Do not apply liquid medicine, cream medicine, ointment medicine, or makeup to your wound while the skin adhesive is in place. This may loosen the film before your wound is healed.   If a dressing is placed over the wound, be careful not to apply tape directly over the skin adhesive. This may cause the adhesive to be pulled off  before the wound is healed.   Avoid prolonged exposure to sunlight or tanning lamps while the skin adhesive is in place.  The skin adhesive will usually remain in place for 5-10 days, then naturally fall off the skin. Do not pick at the adhesive film.  After Healing: Once the wound has healed, cover the wound with sunscreen during the day for 1 full year. This can help minimize scarring. Exposure to ultraviolet light in the first year will darken the scar. It can take 1-2 years for the scar to lose its redness and to heal completely.  SEEK MEDICAL  CARE IF:  You have a fever. SEEK IMMEDIATE MEDICAL CARE IF:  You have redness, pain, or swelling around the wound.   You see ayellowish-white fluid (pus) coming from the wound.    This information is not intended to replace advice given to you by your health care provider. Make sure you discuss any questions you have with your health care provider.   Document Released: 12/22/2004 Document Revised: 12/05/2014 Document Reviewed: 06/27/2013 Elsevier Interactive Patient Education [employer].

## 2016-02-25 NOTE — ED Provider Notes (Signed)
[facility] Emergency Department Provider Note  ____________________________________________  Time seen: 12:30 AM  I have reviewed the triage vital signs and the nursing notes.   HISTORY  Chief Complaint Fall      HPI ZAIDON HIGA is a 65 y.o. male presents to the emergency department status post accidental slip and fall with right forehead injury. Patient denies any loss of consciousness she denies any pain at this time patient denies taking any blood thinners. Last DTaP was 2 years ago    Past Medical History  Diagnosis Date  . Left inguinal hernia 07/16/2012  . Sleep apnea 5-6 yrs ago    CPAP recommended-pt has but does not use.    There are no active problems to display for this patient.   Past Surgical History  Procedure Laterality Date  . Tonsillectomy    . Hand surgery  12 years ago  . Inguinal hernia repair  07/31/2012    Procedure: HERNIA REPAIR INGUINAL ADULT;  Surgeon: Haywood Lasso, MD;  Location: [place];  Service: General;  Laterality: Left;  . Hernia repair      No current outpatient prescriptions on file.  Allergies Review of patient's allergies indicates no known allergies.  Family History  Problem Relation Age of Onset  . Cancer Father     stomach    Social History Social History  Substance Use Topics  . Smoking status: Never Smoker   . Smokeless tobacco: Never Used  . Alcohol Use: No     Comment: rare    Review of Systems  Constitutional: Negative for fever. Eyes: Negative for visual changes. ENT: Negative for sore throat. Cardiovascular: Negative for chest pain. Respiratory: Negative for shortness of breath. Gastrointestinal: Negative for abdominal pain, vomiting and diarrhea. Genitourinary: Negative for dysuria. Musculoskeletal: Negative for back pain. Skin: Negative for rash. Positive for right forehead laceration  Neurological: Negative for headaches, focal weakness or  numbness.   10-point ROS otherwise negative.  ____________________________________________   PHYSICAL EXAM:  VITAL SIGNS: ED Triage Vitals  Enc Vitals Group     BP 02/24/16 2131 172/85 mmHg     Pulse Rate 02/24/16 2131 73     Resp 02/24/16 2131 18     Temp 02/24/16 2131 98.9 F (37.2 C)     Temp Source 02/24/16 2131 Oral     SpO2 02/24/16 2131 96 %     Weight --      Height --      Head Cir --      Peak Flow --      Pain Score 02/24/16 2131 0     Pain Loc --      Pain Edu? --      Excl. in [place]? --      Constitutional: Alert and oriented. Well appearing and in no distress. Eyes: Conjunctivae are normal. PERRL. Normal extraocular movements. ENT   Head: Normocephalic and atraumatic.   Nose: No congestion/rhinnorhea.   Mouth/Throat: Mucous membranes are moist.   Neck: No stridor. Hematological/Lymphatic/Immunilogical: No cervical lymphadenopathy. Cardiovascular: Normal rate, regular rhythm. Normal and symmetric distal pulses are present in all extremities. No murmurs, rubs, or gallops. Respiratory: Normal respiratory effort without tachypnea nor retractions. Breath sounds are clear and equal bilaterally. No wheezes/rales/rhonchi. Gastrointestinal: Soft and nontender. No distention. There is no CVA tenderness. Genitourinary: deferred Musculoskeletal: Nontender with normal range of motion in all extremities. No joint effusions.  No lower extremity tenderness nor edema. Neurologic:  Normal speech and  language. No gross focal neurologic deficits are appreciated. Speech is normal.  Skin:  Linear right forehead 5 x 1 cm laceration Psychiatric: Mood and affect are normal. Speech and behavior are normal. Patient exhibits appropriate insight and judgment.    RADIOLOGY  CT Head Wo Contrast (Final result) Result time: 02/24/16 22:13:04   Final result by Rad Results In Interface (02/24/16 22:13:04)   Narrative:   CLINICAL DATA: Fall with head injury and forehead  laceration.  EXAM: CT HEAD WITHOUT CONTRAST  TECHNIQUE: Contiguous axial images were obtained from the base of the skull through the vertex without intravenous contrast.  COMPARISON: 11/08/2015 head CT.  FINDINGS: There is a right supraorbital scalp contusion with associated laceration. There is a separate more superior right medial frontoparietal scalp contusion.  Embolization coils are present in the right superior cerebellar hemisphere. There is a suggestion of a small amount of encephalomalacia in the superior cerebellar hemispheres. No evidence of residual or new parenchymal hemorrhage or extra-axial fluid collection. No new mass lesion, mass effect, or midline shift. There is a small curvilinear right parafalcine calcification near the vertex (series 2/image 28), not appreciably changed, cannot exclude a small meningioma.  No CT evidence of acute infarction.  Cerebral volume is age appropriate. No ventriculomegaly.  Minimal mucoperiosteal thickening versus secretions in the sphenoid sinus. No fluid levels in the visualized paranasal sinuses. The mastoid air cells are unopacified. No evidence of calvarial fracture.  IMPRESSION: 1. Right supraorbital scalp contusion with associated laceration. Separate more superior right medial frontoparietal scalp contusion. No evidence of acute intracranial abnormality. 2. Embolization coils in the right superior cerebellar hemisphere. Probable mild encephalomalacia in the superior cerebral hemispheres. No residual parenchymal hemorrhage or extra-axial fluid collection at the site of the posterior fossa bleed seen on the 11/08/2015 head CT. 3. Small curvilinear right parafalcine calcification near the vertex, not appreciably changed, cannot exclude a small meningioma. Correlate with brain MRI on a short term outpatient basis without and with IV contrast as clinically warranted.   Electronically Signed By: Ilona Sorrel  M.D. On: 02/24/2016 22:13   Procedure note: LACERATION REPAIR Performed by: Gregor Hams Authorized by: Gregor Hams Consent: Verbal consent obtained. Risks and benefits: risks, benefits and alternatives were discussed Consent given by: patient Patient identity confirmed: provided demographic data Prepped and Draped in normal sterile fashion Wound explored  Laceration Location: Right forehead  Laceration Length: 5cm  No Foreign Bodies seen or palpated  Anesthesia: local infiltration  Local anesthetic: lidocaine 1%   Anesthetic total: 5 ml  Irrigation method: syringe Amount of cleaning: standard  Skin closure: 6-0 nylon   Number of sutures: 6  Technique: Simple interrupted   Patient tolerance: Patient tolerated the procedure well with no immediate complications.   INITIAL IMPRESSION / ASSESSMENT AND PLAN / ED COURSE  Pertinent labs & imaging results that were available during my care of the patient were reviewed by me and considered in my medical decision making (see chart for details).   ____________________________________________   FINAL CLINICAL IMPRESSION(S) / ED DIAGNOSES  Final diagnoses:  Laceration of right side of forehead with complication, initial encounter      Gregor Hams, MD 02/25/16 [phone]

## 2016-02-29 DIAGNOSIS — Q282 Arteriovenous malformation of cerebral vessels: Secondary | ICD-10-CM | POA: Diagnosis not present

## 2016-02-29 DIAGNOSIS — I10 Essential (primary) hypertension: Secondary | ICD-10-CM | POA: Diagnosis not present

## 2016-02-29 DIAGNOSIS — E785 Hyperlipidemia, unspecified: Secondary | ICD-10-CM | POA: Diagnosis not present

## 2016-03-02 DIAGNOSIS — I10 Essential (primary) hypertension: Secondary | ICD-10-CM | POA: Diagnosis not present

## 2016-03-02 DIAGNOSIS — E785 Hyperlipidemia, unspecified: Secondary | ICD-10-CM | POA: Diagnosis not present

## 2016-03-02 DIAGNOSIS — S0181XD Laceration without foreign body of other part of head, subsequent encounter: Secondary | ICD-10-CM | POA: Diagnosis not present

## 2016-03-02 DIAGNOSIS — I629 Nontraumatic intracranial hemorrhage, unspecified: Secondary | ICD-10-CM | POA: Diagnosis not present

## 2016-09-09 DIAGNOSIS — E785 Hyperlipidemia, unspecified: Secondary | ICD-10-CM | POA: Diagnosis not present

## 2016-09-09 DIAGNOSIS — Z125 Encounter for screening for malignant neoplasm of prostate: Secondary | ICD-10-CM | POA: Diagnosis not present

## 2016-09-09 DIAGNOSIS — I10 Essential (primary) hypertension: Secondary | ICD-10-CM | POA: Diagnosis not present

## 2016-09-09 DIAGNOSIS — Z0000 Encounter for general adult medical examination without abnormal findings: Secondary | ICD-10-CM | POA: Diagnosis not present

## 2016-10-11 DIAGNOSIS — B9689 Other specified bacterial agents as the cause of diseases classified elsewhere: Secondary | ICD-10-CM | POA: Diagnosis not present

## 2016-10-11 DIAGNOSIS — J208 Acute bronchitis due to other specified organisms: Secondary | ICD-10-CM | POA: Diagnosis not present

## 2016-10-25 DIAGNOSIS — Z1211 Encounter for screening for malignant neoplasm of colon: Secondary | ICD-10-CM | POA: Diagnosis not present

## 2016-10-25 DIAGNOSIS — R1310 Dysphagia, unspecified: Secondary | ICD-10-CM | POA: Diagnosis not present

## 2016-10-25 DIAGNOSIS — Z8371 Family history of colonic polyps: Secondary | ICD-10-CM | POA: Diagnosis not present

## 2016-11-29 DIAGNOSIS — D473 Essential (hemorrhagic) thrombocythemia: Secondary | ICD-10-CM | POA: Diagnosis not present

## 2016-11-29 DIAGNOSIS — R972 Elevated prostate specific antigen [PSA]: Secondary | ICD-10-CM | POA: Diagnosis not present

## 2016-12-21 DIAGNOSIS — I10 Essential (primary) hypertension: Secondary | ICD-10-CM | POA: Diagnosis not present

## 2016-12-21 DIAGNOSIS — E785 Hyperlipidemia, unspecified: Secondary | ICD-10-CM | POA: Diagnosis not present

## 2016-12-28 DIAGNOSIS — I10 Essential (primary) hypertension: Secondary | ICD-10-CM | POA: Diagnosis not present

## 2017-01-02 ENCOUNTER — Encounter: Payer: Self-pay | Admitting: Urology

## 2017-01-02 ENCOUNTER — Ambulatory Visit (INDEPENDENT_AMBULATORY_CARE_PROVIDER_SITE_OTHER): Payer: PPO | Admitting: Urology

## 2017-01-02 VITALS — BP 130/74 | HR 69 | Ht 74.0 in | Wt 170.0 lb

## 2017-01-02 DIAGNOSIS — R972 Elevated prostate specific antigen [PSA]: Secondary | ICD-10-CM

## 2017-01-02 NOTE — Progress Notes (Signed)
01/02/2017 3:59 PM   Jeffrey Carr 1951-10-16 [id number]  Referring provider: Juluis Pitch, MD [phone] [place], [place] [address]  Chief Complaint  Patient presents with  . New Patient (Initial Visit)    HPI: 66 year old male who presents today for further evaluation of an elevated PSA. The patient comes in Carr his own accord, but is a patient of Dr. Reuel Boom. He has had his PSA checked several times over the past 18 months. The patient has an ongoing was diagnosed with prostate cancer, albeit in his late 41. The patient's uncle is still alive after undergoing treatment. He is 91. The patient describes no significant voiding symptoms, and no progressive symptoms over the past 6 months. He does have some urinary urgency if he waits too long to void. He occasionally gets up Carr night. He has a good strong stream and feels that he empties his bladder completely. He denies a history of dysuria. The patient also has erectile dysfunction and several years prior had tried sildenafil. He did not take this and the correct way, but did not take it for second time. He was able to obtain an erection with only 60 mg of sildenafil.  PSA history: 1.3 on 11/29/16 1.7 on 08/2016 1.3 on 08/2015     PMH: Past Medical History:  Diagnosis Date  . Left inguinal hernia 07/16/2012  . Sleep apnea 5-6 yrs ago   CPAP recommended-pt has but does not use.    Surgical History: Past Surgical History:  Procedure Laterality Date  . HAND SURGERY  12 years ago  . HERNIA REPAIR    . INGUINAL HERNIA REPAIR  07/31/2012   Procedure: HERNIA REPAIR INGUINAL ADULT;  Surgeon: Haywood Lasso, MD;  Location: [facility];  Service: General;  Laterality: Left;  . TONSILLECTOMY      Home Medications:  Allergies as of 01/02/2017   No Known Allergies     Medication List       Accurate as of 01/02/17  3:59 PM. Always use your most recent med list.          atorvastatin 80 MG  tablet Commonly known as:  LIPITOR Take 80 mg by mouth.   lisinopril 20 MG tablet Commonly known as:  PRINIVIL,ZESTRIL       Allergies: No Known Allergies  Family History: Family History  Problem Relation Age of Onset  . Cancer Father     stomach  . Prostate cancer Paternal Uncle     Social History:  reports that he has never smoked. He has never used smokeless tobacco. He reports that he does not drink alcohol or use drugs.  ROS: UROLOGY Frequent Urination?: Yes Hard to postpone urination?: Yes Burning/pain with urination?: No Get up Carr night to urinate?: No Leakage of urine?: Yes Urine stream starts and stops?: No Trouble starting stream?: No Do you have to strain to urinate?: No Blood in urine?: No Urinary tract infection?: No Sexually transmitted disease?: No Injury to kidneys or bladder?: No Painful intercourse?: No Weak stream?: No Erection problems?: Yes Penile pain?: No  Gastrointestinal Nausea?: No Vomiting?: No Indigestion/heartburn?: No Diarrhea?: No Constipation?: No  Constitutional Fever: No Night sweats?: No Weight loss?: No Fatigue?: No  Skin Skin rash/lesions?: No Itching?: Yes  Eyes Blurred vision?: No Double vision?: No  Ears/Nose/Throat Sore throat?: No Sinus problems?: No  Hematologic/Lymphatic Swollen glands?: No Easy bruising?: No  Cardiovascular Leg swelling?: No Chest pain?: No  Respiratory Cough?: No Shortness of breath?:  No  Endocrine Excessive thirst?: No  Musculoskeletal Back pain?: No Joint pain?: No  Neurological Headaches?: No Dizziness?: No  Psychologic Depression?: No Anxiety?: No  Physical Exam: BP 130/74   Pulse 69   Ht 6\' 2"  (1.88 m)   Wt 77.1 kg (170 lb)   BMI 21.83 kg/m   Constitutional:  Alert and oriented, No acute distress. HEENT: Jeffrey Carr, moist mucus membranes.  Trachea midline, no masses. Cardiovascular: No clubbing, cyanosis, or edema. Respiratory: Normal respiratory  effort, no increased work of breathing. GI: Abdomen is soft, nontender, nondistended, no abdominal masses GU: No CVA tenderness.  DRE: Plus 2 prostate, smooth, symmetric, no nodules Skin: No rashes, bruises or suspicious lesions. Lymph: No cervical or inguinal adenopathy. Neurologic: Grossly intact, no focal deficits, moving all 4 extremities. Psychiatric: Normal mood and affect.  Laboratory Data: Lab Results  Component Value Date   WBC 15.8 (H) 11/08/2015   HGB 15.6 11/08/2015   HCT 45.7 11/08/2015   MCV 87.1 11/08/2015   PLT 168 11/08/2015    Lab Results  Component Value Date   CREATININE 0.88 11/08/2015    No results found for: PSA  No results found for: TESTOSTERONE  No results found for: HGBA1C  Urinalysis No results found for: COLORURINE, APPEARANCEUR, LABSPEC, PHURINE, GLUCOSEU, HGBUR, BILIRUBINUR, KETONESUR, PROTEINUR, UROBILINOGEN, NITRITE, LEUKOCYTESUR  Pertinent Imaging: None  Assessment & Plan:  The patient has a normal PSA and a normal rectal exam. He has minimal voiding symptoms. He does have some mild erectile dysfunction. His family history of prostate cancer is clinically not significant given that its his uncle and he was diagnosed in his late 64 with low-grade prostate cancer.  I reassured the patient in regards to his PSA and his risk for prostate cancer. We briefly discussed seldom a fill and its appropriate use, the patient has some Carr home that he may use in the future. He needs no medication or follow-up for his voiding symptoms. As such, I'll plan to follow up with him on an as-needed basis.  Cc: Dr. Juluis Pitch, M.D.  There are no diagnoses linked to this encounter.  No Follow-up on file.  Ardis Hughs, [facility] [address], [place], [place] [address] [phone]

## 2017-03-24 ENCOUNTER — Encounter: Payer: Self-pay | Admitting: *Deleted

## 2017-03-27 ENCOUNTER — Ambulatory Visit: Payer: PPO | Admitting: Anesthesiology

## 2017-03-27 ENCOUNTER — Encounter: Payer: Self-pay | Admitting: *Deleted

## 2017-03-27 ENCOUNTER — Encounter
Admission: RE | Disposition: A | Discharge status: Home / Self Care | Payer: Self-pay | Source: Ambulatory Visit | Attending: Unknown Physician Specialty

## 2017-03-27 ENCOUNTER — Ambulatory Visit
Admission: RE | Admit: 2017-03-27 | Discharge: 2017-03-27 | Disposition: A | Discharge status: Home / Self Care | Payer: PPO | Source: Ambulatory Visit | Attending: Unknown Physician Specialty | Admitting: Unknown Physician Specialty

## 2017-03-27 DIAGNOSIS — K640 First degree hemorrhoids: Secondary | ICD-10-CM | POA: Insufficient documentation

## 2017-03-27 DIAGNOSIS — Z1211 Encounter for screening for malignant neoplasm of colon: Secondary | ICD-10-CM | POA: Diagnosis not present

## 2017-03-27 DIAGNOSIS — Z79899 Other long term (current) drug therapy: Secondary | ICD-10-CM | POA: Diagnosis not present

## 2017-03-27 DIAGNOSIS — G4730 Sleep apnea, unspecified: Secondary | ICD-10-CM | POA: Diagnosis not present

## 2017-03-27 DIAGNOSIS — K648 Other hemorrhoids: Secondary | ICD-10-CM | POA: Diagnosis not present

## 2017-03-27 HISTORY — PX: COLONOSCOPY WITH PROPOFOL: SHX5780

## 2017-03-27 SURGERY — COLONOSCOPY WITH PROPOFOL
Anesthesia: General

## 2017-03-27 MED ORDER — MIDAZOLAM HCL 5 MG/5ML IJ SOLN
INTRAMUSCULAR | Status: DC | PRN
  Administered 2017-03-27: 1 mg via INTRAVENOUS

## 2017-03-27 MED ORDER — EPHEDRINE SULFATE 50 MG/ML IJ SOLN
INTRAMUSCULAR | Status: DC | PRN
  Administered 2017-03-27: 10 mg via INTRAVENOUS
  Administered 2017-03-27: 5 mg via INTRAVENOUS
  Administered 2017-03-27: 10 mg via INTRAVENOUS

## 2017-03-27 MED ORDER — SODIUM CHLORIDE 0.9 % IV SOLN
INTRAVENOUS | Status: DC
  Administered 2017-03-27: 1000 mL via INTRAVENOUS

## 2017-03-27 MED ORDER — LIDOCAINE HCL (PF) 2 % IJ SOLN
INTRAMUSCULAR | Status: AC
  Filled 2017-03-27: qty 2

## 2017-03-27 MED ORDER — MIDAZOLAM HCL 2 MG/2ML IJ SOLN
INTRAMUSCULAR | Status: AC
  Filled 2017-03-27: qty 2

## 2017-03-27 MED ORDER — SODIUM CHLORIDE 0.9 % IV SOLN: INTRAVENOUS | Status: DC

## 2017-03-27 MED ORDER — FENTANYL CITRATE (PF) 100 MCG/2ML IJ SOLN
INTRAMUSCULAR | Status: AC
  Filled 2017-03-27: qty 2

## 2017-03-27 MED ORDER — LIDOCAINE HCL (PF) 2 % IJ SOLN
INTRAMUSCULAR | Status: DC | PRN
  Administered 2017-03-27: 5 mg

## 2017-03-27 MED ORDER — PROPOFOL 500 MG/50ML IV EMUL
INTRAVENOUS | Status: DC | PRN
  Administered 2017-03-27: 50 ug/kg/min via INTRAVENOUS

## 2017-03-27 MED ORDER — PROPOFOL 10 MG/ML IV BOLUS
INTRAVENOUS | Status: AC
  Filled 2017-03-27: qty 20

## 2017-03-27 MED ORDER — FENTANYL CITRATE (PF) 100 MCG/2ML IJ SOLN
INTRAMUSCULAR | Status: DC | PRN
Start: 2017-03-27 — End: 2017-03-27
  Administered 2017-03-27: 50 ug via INTRAVENOUS

## 2017-03-27 MED ORDER — PROPOFOL 10 MG/ML IV BOLUS
INTRAVENOUS | Status: DC | PRN
  Administered 2017-03-27: 30 mg via INTRAVENOUS
  Administered 2017-03-27: 10 mg via INTRAVENOUS

## 2017-03-27 NOTE — Anesthesia Postprocedure Evaluation (Signed)
Anesthesia Post Note  Patient: Jeffrey Carr  Procedure(s) Performed: Procedure(s) (LRB): COLONOSCOPY WITH PROPOFOL (N/A)  Patient location during evaluation: Endoscopy Anesthesia Type: General Level of consciousness: awake and alert Pain management: pain level controlled Vital Signs Assessment: post-procedure vital signs reviewed and stable Respiratory status: spontaneous breathing, nonlabored ventilation, respiratory function stable and patient connected to nasal cannula oxygen Cardiovascular status: blood pressure returned to baseline and stable Postop Assessment: no signs of nausea or vomiting Anesthetic complications: no     Last Vitals:  Vitals:   03/27/17 1417 03/27/17 1427  BP: (!) 123/59 132/63  Pulse: (!) 51 (!) 48  Resp: 18 16  Temp:      Last Pain:  Vitals:   03/27/17 1357  TempSrc: Tympanic                 Martha Clan

## 2017-03-27 NOTE — Anesthesia Preprocedure Evaluation (Signed)
Anesthesia Evaluation  Patient identified by MRN, date of birth, ID band Patient awake    Reviewed: Allergy & Precautions, H&P , NPO status , Patient's Chart, lab work & pertinent test results, reviewed documented beta blocker date and time   History of Anesthesia Complications Negative for: history of anesthetic complications  Airway Mallampati: I  TM Distance: >3 FB Neck ROM: full    Dental  (+) Caps, Missing   Pulmonary neg shortness of breath, sleep apnea , neg COPD, neg recent URI,           Cardiovascular Exercise Tolerance: Good hypertension, (-) angina(-) CAD, (-) Past MI, (-) Cardiac Stents and (-) CABG (-) dysrhythmias (-) Valvular Problems/Murmurs     Neuro/Psych neg Seizures CVA (hemorrhage), Residual Symptoms negative psych ROS   GI/Hepatic negative GI ROS, Neg liver ROS,   Endo/Other  negative endocrine ROS  Renal/GU negative Renal ROS  negative genitourinary   Musculoskeletal   Abdominal   Peds  Hematology negative hematology ROS (+)   Anesthesia Other Findings Past Medical History: 07/16/2012: Left inguinal hernia 5-6 yrs ago: Sleep apnea     Comment: CPAP recommended-pt has but does not use.   Reproductive/Obstetrics negative OB ROS                             Anesthesia Physical Anesthesia Plan  ASA: II  Anesthesia Plan: General   Post-op Pain Management:    Induction:   Airway Management Planned:   Additional Equipment:   Intra-op Plan:   Post-operative Plan:   Informed Consent: I have reviewed the patients History and Physical, chart, labs and discussed the procedure including the risks, benefits and alternatives for the proposed anesthesia with the patient or authorized representative who has indicated his/her understanding and acceptance.   Dental Advisory Given  Plan Discussed with: Anesthesiologist, CRNA and Surgeon  Anesthesia Plan Comments:          Anesthesia Quick Evaluation

## 2017-03-27 NOTE — Transfer of Care (Signed)
Immediate Anesthesia Transfer of Care Note  Patient: JAIVEN GRAVELINE  Procedure(s) Performed: Procedure(s): COLONOSCOPY WITH PROPOFOL (N/A)  Patient Location: PACU  Anesthesia Type:General  Level of Consciousness: sedated  Airway & Oxygen Therapy: Patient Spontanous Breathing and Patient connected to nasal cannula oxygen  Post-op Assessment: Report given to RN and Post -op Vital signs reviewed and stable  Post vital signs: Reviewed and stable  Last Vitals:  Vitals:   03/27/17 1242 03/27/17 1357  BP: 109/61 (!) 90/57  Pulse: (!) 57 (!) 55  Resp: 20 17  Temp: (!) 35.9 C 36.3 C    Last Pain:  Vitals:   03/27/17 1357  TempSrc: Tympanic      Patients Stated Pain Goal: 0 (73/08/56 9437)  Complications: No apparent anesthesia complications

## 2017-03-27 NOTE — Anesthesia Post-op Follow-up Note (Cosign Needed)
Anesthesia QCDR form completed.        

## 2017-03-27 NOTE — Op Note (Signed)
[facility] Gastroenterology Patient Name: Jeffrey Carr Procedure Date: 03/27/2017 1:31 PM MRN: [id number] Account #: [id number] Date of Birth: October 19, 1951 Admit Type: Outpatient Age: 66 Room: [facility] ENDO ROOM 1 Gender: Male Note Status: Finalized Procedure:            Colonoscopy Indications:          Screening for colorectal malignant neoplasm Providers:            Manya Silvas, MD Referring MD:         Youlanda Roys. Lovie Macadamia, MD (Referring MD) Medicines:            Propofol per Anesthesia Complications:        No immediate complications. Procedure:            Pre-Anesthesia Assessment:                       - After reviewing the risks and benefits, the patient                        was deemed in satisfactory condition to undergo the                        procedure.                       After obtaining informed consent, the colonoscope was                        passed under direct vision. Throughout the procedure,                        the patient's blood pressure, pulse, and oxygen                        saturations were monitored continuously. The                        Colonoscope was introduced through the anus and                        advanced to the the cecum, identified by appendiceal                        orifice and ileocecal valve. The colonoscopy was                        performed without difficulty. The patient tolerated the                        procedure well. The quality of the bowel preparation                        was good. Findings:      Internal hemorrhoids were found during endoscopy. The hemorrhoids were       small and Grade I (internal hemorrhoids that do not prolapse).      The exam was otherwise without abnormality. Cecum reached but not       photographed. Impression:           - Internal hemorrhoids.                       -  The examination was otherwise normal.                       - No specimens  collected. Recommendation:       - Repeat colonoscopy in 10 years for screening purposes. Manya Silvas, MD 03/27/2017 1:54:51 PM This report has been signed electronically. Number of Addenda: 0 Note Initiated On: 03/27/2017 1:31 PM Scope Withdrawal Time: 0 hours 8 minutes 18 seconds  Total Procedure Duration: 0 hours 17 minutes 27 seconds       [facility]

## 2017-03-27 NOTE — H&P (Signed)
   Primary Care Physician:  Juluis Pitch, MD Primary Gastroenterologist:  Dr. Vira Agar  Pre-Procedure History & Physical: HPI:  Jeffrey Carr is a 66 y.o. male is here for an colonoscopy.   Past Medical History:  Diagnosis Date  . Left inguinal hernia 07/16/2012  . Sleep apnea 5-6 yrs ago   CPAP recommended-pt has but does not use.    Past Surgical History:  Procedure Laterality Date  . HAND SURGERY  12 years ago  . HERNIA REPAIR    . INGUINAL HERNIA REPAIR  07/31/2012   Procedure: HERNIA REPAIR INGUINAL ADULT;  Surgeon: Haywood Lasso, MD;  Location: [place];  Service: General;  Laterality: Left;  . TONSILLECTOMY      Prior to Admission medications   Medication Sig Start Date End Date Taking? Authorizing Provider  lisinopril (PRINIVIL,ZESTRIL) 20 MG tablet  12/28/16  Yes Historical Provider, MD  sildenafil (REVATIO) 20 MG tablet Take 20 mg by mouth 3 (three) times daily.   Yes Historical Provider, MD  atorvastatin (LIPITOR) 80 MG tablet Take 80 mg by mouth. 01/27/16   Historical Provider, MD    Allergies as of 12/23/2016  . (No Known Allergies)    Family History  Problem Relation Age of Onset  . Cancer Father     stomach  . Prostate cancer Paternal Uncle     Social History   Social History  . Marital status: Married    Spouse name: N/A  . Number of children: N/A  . Years of education: N/A   Occupational History  . Not on file.   Social History Main Topics  . Smoking status: Never Smoker  . Smokeless tobacco: Never Used  . Alcohol use No     Comment: rare  . Drug use: No  . Sexual activity: Not on file   Other Topics Concern  . Not on file   Social History Narrative  . No narrative on file    Review of Systems: See HPI, otherwise negative ROS  Physical Exam: BP 109/61   Pulse (!) 57   Temp (!) 96.6 F (35.9 C) (Tympanic)   Resp 20   Ht 6\' 2"  (1.88 m)   Wt 77.1 kg (170 lb)   SpO2 99%   BMI 21.83 kg/m  General:   Alert,   pleasant and cooperative in NAD Head:  Normocephalic and atraumatic. Neck:  Supple; no masses or thyromegaly. Lungs:  Clear throughout to auscultation.    Heart:  Regular rate and rhythm. Abdomen:  Soft, nontender and nondistended. Normal bowel sounds, without guarding, and without rebound.   Neurologic:  Alert and  oriented x4;  grossly normal neurologically.  Impression/Plan: Jeffrey Carr is here for an colonoscopy to be performed for screening colonoscopy  Risks, benefits, limitations, and alternatives regarding  colonoscopy have been reviewed with the patient.  Questions have been answered.  All parties agreeable.   Gaylyn Cheers, MD  03/27/2017, 1:28 PM

## 2017-03-28 ENCOUNTER — Encounter: Payer: Self-pay | Admitting: Unknown Physician Specialty

## 2017-06-14 DIAGNOSIS — I10 Essential (primary) hypertension: Secondary | ICD-10-CM | POA: Diagnosis not present

## 2017-06-14 DIAGNOSIS — E785 Hyperlipidemia, unspecified: Secondary | ICD-10-CM | POA: Diagnosis not present

## 2017-06-19 DIAGNOSIS — E785 Hyperlipidemia, unspecified: Secondary | ICD-10-CM | POA: Diagnosis not present

## 2017-06-19 DIAGNOSIS — I10 Essential (primary) hypertension: Secondary | ICD-10-CM | POA: Diagnosis not present

## 2018-01-17 DIAGNOSIS — J069 Acute upper respiratory infection, unspecified: Secondary | ICD-10-CM | POA: Diagnosis not present

## 2018-02-16 DIAGNOSIS — I10 Essential (primary) hypertension: Secondary | ICD-10-CM | POA: Diagnosis not present

## 2018-02-16 DIAGNOSIS — R634 Abnormal weight loss: Secondary | ICD-10-CM | POA: Diagnosis not present

## 2018-02-16 DIAGNOSIS — I629 Nontraumatic intracranial hemorrhage, unspecified: Secondary | ICD-10-CM | POA: Diagnosis not present

## 2018-02-16 DIAGNOSIS — Z0000 Encounter for general adult medical examination without abnormal findings: Secondary | ICD-10-CM | POA: Diagnosis not present

## 2018-02-16 DIAGNOSIS — E785 Hyperlipidemia, unspecified: Secondary | ICD-10-CM | POA: Diagnosis not present

## 2018-02-16 DIAGNOSIS — E349 Endocrine disorder, unspecified: Secondary | ICD-10-CM | POA: Diagnosis not present

## 2018-02-16 DIAGNOSIS — Z125 Encounter for screening for malignant neoplasm of prostate: Secondary | ICD-10-CM | POA: Diagnosis not present

## 2018-05-09 DIAGNOSIS — H538 Other visual disturbances: Secondary | ICD-10-CM | POA: Diagnosis not present

## 2018-05-09 DIAGNOSIS — H2513 Age-related nuclear cataract, bilateral: Secondary | ICD-10-CM | POA: Diagnosis not present

## 2018-05-09 DIAGNOSIS — H5203 Hypermetropia, bilateral: Secondary | ICD-10-CM | POA: Diagnosis not present

## 2018-05-09 DIAGNOSIS — H524 Presbyopia: Secondary | ICD-10-CM | POA: Diagnosis not present

## 2018-05-09 DIAGNOSIS — H04123 Dry eye syndrome of bilateral lacrimal glands: Secondary | ICD-10-CM | POA: Diagnosis not present

## 2018-05-22 DIAGNOSIS — R972 Elevated prostate specific antigen [PSA]: Secondary | ICD-10-CM | POA: Diagnosis not present

## 2018-06-04 DIAGNOSIS — E785 Hyperlipidemia, unspecified: Secondary | ICD-10-CM | POA: Diagnosis not present

## 2018-06-04 DIAGNOSIS — I629 Nontraumatic intracranial hemorrhage, unspecified: Secondary | ICD-10-CM | POA: Diagnosis not present

## 2018-06-04 DIAGNOSIS — I10 Essential (primary) hypertension: Secondary | ICD-10-CM | POA: Diagnosis not present

## 2018-08-24 DIAGNOSIS — R5383 Other fatigue: Secondary | ICD-10-CM | POA: Diagnosis not present

## 2018-08-24 DIAGNOSIS — E785 Hyperlipidemia, unspecified: Secondary | ICD-10-CM | POA: Diagnosis not present

## 2018-08-24 DIAGNOSIS — I10 Essential (primary) hypertension: Secondary | ICD-10-CM | POA: Diagnosis not present

## 2018-08-24 DIAGNOSIS — I629 Nontraumatic intracranial hemorrhage, unspecified: Secondary | ICD-10-CM | POA: Diagnosis not present

## 2018-08-27 DIAGNOSIS — E785 Hyperlipidemia, unspecified: Secondary | ICD-10-CM | POA: Diagnosis not present

## 2018-08-27 DIAGNOSIS — R5383 Other fatigue: Secondary | ICD-10-CM | POA: Diagnosis not present

## 2018-08-27 DIAGNOSIS — I10 Essential (primary) hypertension: Secondary | ICD-10-CM | POA: Diagnosis not present

## 2018-08-29 DIAGNOSIS — Q282 Arteriovenous malformation of cerebral vessels: Secondary | ICD-10-CM | POA: Diagnosis not present

## 2019-04-26 DIAGNOSIS — I629 Nontraumatic intracranial hemorrhage, unspecified: Secondary | ICD-10-CM | POA: Diagnosis not present

## 2019-04-26 DIAGNOSIS — I10 Essential (primary) hypertension: Secondary | ICD-10-CM | POA: Diagnosis not present

## 2019-04-26 DIAGNOSIS — Z0000 Encounter for general adult medical examination without abnormal findings: Secondary | ICD-10-CM | POA: Diagnosis not present

## 2019-04-26 DIAGNOSIS — R5383 Other fatigue: Secondary | ICD-10-CM | POA: Diagnosis not present

## 2019-04-26 DIAGNOSIS — E785 Hyperlipidemia, unspecified: Secondary | ICD-10-CM | POA: Diagnosis not present

## 2019-04-29 DIAGNOSIS — E349 Endocrine disorder, unspecified: Secondary | ICD-10-CM | POA: Diagnosis not present

## 2019-04-29 DIAGNOSIS — I10 Essential (primary) hypertension: Secondary | ICD-10-CM | POA: Diagnosis not present

## 2019-04-29 DIAGNOSIS — R5383 Other fatigue: Secondary | ICD-10-CM | POA: Diagnosis not present

## 2019-05-08 DIAGNOSIS — H2513 Age-related nuclear cataract, bilateral: Secondary | ICD-10-CM | POA: Diagnosis not present

## 2019-05-08 DIAGNOSIS — H1045 Other chronic allergic conjunctivitis: Secondary | ICD-10-CM | POA: Diagnosis not present

## 2020-04-28 DIAGNOSIS — Z125 Encounter for screening for malignant neoplasm of prostate: Secondary | ICD-10-CM | POA: Diagnosis not present

## 2020-04-28 DIAGNOSIS — I10 Essential (primary) hypertension: Secondary | ICD-10-CM | POA: Diagnosis not present

## 2020-04-28 DIAGNOSIS — E349 Endocrine disorder, unspecified: Secondary | ICD-10-CM | POA: Diagnosis not present

## 2020-04-28 DIAGNOSIS — E785 Hyperlipidemia, unspecified: Secondary | ICD-10-CM | POA: Diagnosis not present

## 2020-04-28 DIAGNOSIS — R5383 Other fatigue: Secondary | ICD-10-CM | POA: Diagnosis not present

## 2020-05-04 DIAGNOSIS — E785 Hyperlipidemia, unspecified: Secondary | ICD-10-CM | POA: Diagnosis not present

## 2020-05-04 DIAGNOSIS — Z0000 Encounter for general adult medical examination without abnormal findings: Secondary | ICD-10-CM | POA: Diagnosis not present

## 2020-05-04 DIAGNOSIS — I629 Nontraumatic intracranial hemorrhage, unspecified: Secondary | ICD-10-CM | POA: Diagnosis not present

## 2020-05-04 DIAGNOSIS — E349 Endocrine disorder, unspecified: Secondary | ICD-10-CM | POA: Diagnosis not present

## 2020-05-04 DIAGNOSIS — I10 Essential (primary) hypertension: Secondary | ICD-10-CM | POA: Diagnosis not present

## 2020-05-11 DIAGNOSIS — H2513 Age-related nuclear cataract, bilateral: Secondary | ICD-10-CM | POA: Diagnosis not present

## 2020-05-11 DIAGNOSIS — H5203 Hypermetropia, bilateral: Secondary | ICD-10-CM | POA: Diagnosis not present

## 2020-05-11 DIAGNOSIS — H524 Presbyopia: Secondary | ICD-10-CM | POA: Diagnosis not present

## 2020-06-26 DIAGNOSIS — Z20822 Contact with and (suspected) exposure to covid-19: Secondary | ICD-10-CM | POA: Diagnosis not present

## 2020-10-30 DIAGNOSIS — I10 Essential (primary) hypertension: Secondary | ICD-10-CM | POA: Diagnosis not present

## 2020-11-06 DIAGNOSIS — I629 Nontraumatic intracranial hemorrhage, unspecified: Secondary | ICD-10-CM | POA: Diagnosis not present

## 2020-11-06 DIAGNOSIS — E349 Endocrine disorder, unspecified: Secondary | ICD-10-CM | POA: Diagnosis not present

## 2020-11-06 DIAGNOSIS — E785 Hyperlipidemia, unspecified: Secondary | ICD-10-CM | POA: Diagnosis not present

## 2020-11-06 DIAGNOSIS — I10 Essential (primary) hypertension: Secondary | ICD-10-CM | POA: Diagnosis not present

## 2020-11-06 DIAGNOSIS — Z125 Encounter for screening for malignant neoplasm of prostate: Secondary | ICD-10-CM | POA: Diagnosis not present

## 2021-05-21 DIAGNOSIS — E785 Hyperlipidemia, unspecified: Secondary | ICD-10-CM | POA: Diagnosis not present

## 2021-05-21 DIAGNOSIS — Z1331 Encounter for screening for depression: Secondary | ICD-10-CM | POA: Diagnosis not present

## 2021-05-21 DIAGNOSIS — I10 Essential (primary) hypertension: Secondary | ICD-10-CM | POA: Diagnosis not present

## 2021-05-21 DIAGNOSIS — E538 Deficiency of other specified B group vitamins: Secondary | ICD-10-CM | POA: Diagnosis not present

## 2021-05-21 DIAGNOSIS — E349 Endocrine disorder, unspecified: Secondary | ICD-10-CM | POA: Diagnosis not present

## 2021-05-21 DIAGNOSIS — Z0000 Encounter for general adult medical examination without abnormal findings: Secondary | ICD-10-CM | POA: Diagnosis not present

## 2021-05-21 DIAGNOSIS — I629 Nontraumatic intracranial hemorrhage, unspecified: Secondary | ICD-10-CM | POA: Diagnosis not present

## 2021-05-21 DIAGNOSIS — R634 Abnormal weight loss: Secondary | ICD-10-CM | POA: Diagnosis not present

## 2021-05-21 DIAGNOSIS — Z125 Encounter for screening for malignant neoplasm of prostate: Secondary | ICD-10-CM | POA: Diagnosis not present

## 2021-08-13 DIAGNOSIS — H2513 Age-related nuclear cataract, bilateral: Secondary | ICD-10-CM | POA: Diagnosis not present

## 2021-09-15 DIAGNOSIS — R972 Elevated prostate specific antigen [PSA]: Secondary | ICD-10-CM | POA: Diagnosis not present

## 2021-09-15 DIAGNOSIS — R5383 Other fatigue: Secondary | ICD-10-CM | POA: Diagnosis not present

## 2021-09-15 DIAGNOSIS — E538 Deficiency of other specified B group vitamins: Secondary | ICD-10-CM | POA: Diagnosis not present

## 2021-09-15 DIAGNOSIS — R634 Abnormal weight loss: Secondary | ICD-10-CM | POA: Diagnosis not present

## 2021-09-16 DIAGNOSIS — R5383 Other fatigue: Secondary | ICD-10-CM | POA: Diagnosis not present

## 2021-09-16 DIAGNOSIS — R634 Abnormal weight loss: Secondary | ICD-10-CM | POA: Diagnosis not present

## 2021-10-15 DIAGNOSIS — R972 Elevated prostate specific antigen [PSA]: Secondary | ICD-10-CM | POA: Diagnosis not present

## 2021-10-15 DIAGNOSIS — I10 Essential (primary) hypertension: Secondary | ICD-10-CM | POA: Diagnosis not present

## 2021-10-15 DIAGNOSIS — E538 Deficiency of other specified B group vitamins: Secondary | ICD-10-CM | POA: Diagnosis not present

## 2021-10-18 DIAGNOSIS — A048 Other specified bacterial intestinal infections: Secondary | ICD-10-CM | POA: Diagnosis not present

## 2021-10-18 DIAGNOSIS — R300 Dysuria: Secondary | ICD-10-CM | POA: Diagnosis not present

## 2021-11-16 ENCOUNTER — Other Ambulatory Visit (HOSPITAL_COMMUNITY): Payer: Self-pay | Admitting: Gastroenterology

## 2021-11-16 ENCOUNTER — Other Ambulatory Visit: Payer: Self-pay | Admitting: Gastroenterology

## 2021-11-16 ENCOUNTER — Ambulatory Visit
Admission: RE | Admit: 2021-11-16 | Discharge: 2021-11-16 | Disposition: A | Discharge status: Home / Self Care | Payer: PPO | Source: Ambulatory Visit | Attending: Gastroenterology | Admitting: Gastroenterology

## 2021-11-16 DIAGNOSIS — I2510 Atherosclerotic heart disease of native coronary artery without angina pectoris: Secondary | ICD-10-CM | POA: Diagnosis not present

## 2021-11-16 DIAGNOSIS — D492 Neoplasm of unspecified behavior of bone, soft tissue, and skin: Secondary | ICD-10-CM | POA: Diagnosis not present

## 2021-11-16 DIAGNOSIS — R630 Anorexia: Secondary | ICD-10-CM | POA: Diagnosis not present

## 2021-11-16 DIAGNOSIS — R5383 Other fatigue: Secondary | ICD-10-CM | POA: Diagnosis not present

## 2021-11-16 DIAGNOSIS — R634 Abnormal weight loss: Secondary | ICD-10-CM

## 2021-11-16 DIAGNOSIS — R058 Other specified cough: Secondary | ICD-10-CM | POA: Diagnosis not present

## 2021-11-16 DIAGNOSIS — K769 Liver disease, unspecified: Secondary | ICD-10-CM | POA: Diagnosis not present

## 2021-11-16 DIAGNOSIS — R413 Other amnesia: Secondary | ICD-10-CM | POA: Diagnosis not present

## 2021-11-16 DIAGNOSIS — K7689 Other specified diseases of liver: Secondary | ICD-10-CM

## 2021-11-16 DIAGNOSIS — K8689 Other specified diseases of pancreas: Secondary | ICD-10-CM

## 2021-11-16 DIAGNOSIS — R1900 Intra-abdominal and pelvic swelling, mass and lump, unspecified site: Secondary | ICD-10-CM

## 2021-11-16 DIAGNOSIS — K315 Obstruction of duodenum: Secondary | ICD-10-CM | POA: Diagnosis not present

## 2021-11-16 DIAGNOSIS — K3189 Other diseases of stomach and duodenum: Secondary | ICD-10-CM | POA: Diagnosis not present

## 2021-11-16 DIAGNOSIS — R110 Nausea: Secondary | ICD-10-CM | POA: Diagnosis not present

## 2021-11-16 DIAGNOSIS — S2232XA Fracture of one rib, left side, initial encounter for closed fracture: Secondary | ICD-10-CM | POA: Diagnosis not present

## 2021-11-16 DIAGNOSIS — I700 Atherosclerosis of aorta: Secondary | ICD-10-CM | POA: Diagnosis not present

## 2021-11-16 LAB — POCT I-STAT CREATININE: Creatinine, Ser: 1.4 mg/dL — ABNORMAL HIGH (ref 0.61–1.24)

## 2021-11-16 MED ORDER — IOHEXOL 300 MG/ML  SOLN
100.0000 mL | Freq: Once | INTRAMUSCULAR | Status: AC | PRN
  Administered 2021-11-16: 12:00:00 80 mL via INTRAVENOUS

## 2021-11-17 ENCOUNTER — Other Ambulatory Visit: Payer: Self-pay

## 2021-11-17 ENCOUNTER — Ambulatory Visit
Admission: RE | Admit: 2021-11-17 | Discharge: 2021-11-17 | Disposition: A | Discharge status: Home / Self Care | Payer: PPO | Source: Ambulatory Visit | Attending: Gastroenterology | Admitting: Gastroenterology

## 2021-11-17 DIAGNOSIS — K7689 Other specified diseases of liver: Secondary | ICD-10-CM | POA: Insufficient documentation

## 2021-11-17 DIAGNOSIS — K862 Cyst of pancreas: Secondary | ICD-10-CM | POA: Diagnosis not present

## 2021-11-17 DIAGNOSIS — K8689 Other specified diseases of pancreas: Secondary | ICD-10-CM | POA: Diagnosis not present

## 2021-11-17 DIAGNOSIS — R1900 Intra-abdominal and pelvic swelling, mass and lump, unspecified site: Secondary | ICD-10-CM | POA: Diagnosis not present

## 2021-11-17 MED ORDER — GADOBUTROL 1 MMOL/ML IV SOLN
7.5000 mL | Freq: Once | INTRAVENOUS | Status: AC | PRN
  Administered 2021-11-17: 13:00:00 7.5 mL via INTRAVENOUS

## 2021-11-18 ENCOUNTER — Encounter: Payer: Self-pay | Admitting: *Deleted

## 2021-11-18 ENCOUNTER — Telehealth: Payer: Self-pay | Admitting: *Deleted

## 2021-11-18 ENCOUNTER — Encounter: Payer: Self-pay | Admitting: Oncology

## 2021-11-18 NOTE — Telephone Encounter (Signed)
Contacted NP prior to the apt tomorrow. No answer. Left voice mail that we were calling to remind him of his NP apt tomorrow. Should he have any questions or concerns, to contact our office

## 2021-11-18 NOTE — Progress Notes (Signed)
Patient wife states patient is currently not taking any medications but Zofran and Senokot. Patient was dx with sleep apnea a while back but does not use a machine. Patient wife states he couldn't keep food down before taking zofran so he has not had a bowel movement. Zofran has helped with the vomiting. Patient wife states he is always tired and sleepy.

## 2021-11-19 ENCOUNTER — Encounter: Payer: Self-pay | Admitting: Oncology

## 2021-11-19 ENCOUNTER — Other Ambulatory Visit: Payer: Self-pay

## 2021-11-19 ENCOUNTER — Inpatient Hospital Stay: Payer: PPO | Attending: Oncology | Admitting: Oncology

## 2021-11-19 ENCOUNTER — Inpatient Hospital Stay: Payer: PPO

## 2021-11-19 VITALS — BP 118/73 | HR 85 | Temp 96.0°F | Wt 129.3 lb

## 2021-11-19 DIAGNOSIS — Z809 Family history of malignant neoplasm, unspecified: Secondary | ICD-10-CM | POA: Diagnosis not present

## 2021-11-19 DIAGNOSIS — R932 Abnormal findings on diagnostic imaging of liver and biliary tract: Secondary | ICD-10-CM | POA: Insufficient documentation

## 2021-11-19 DIAGNOSIS — R933 Abnormal findings on diagnostic imaging of other parts of digestive tract: Secondary | ICD-10-CM | POA: Insufficient documentation

## 2021-11-19 DIAGNOSIS — Z8673 Personal history of transient ischemic attack (TIA), and cerebral infarction without residual deficits: Secondary | ICD-10-CM | POA: Diagnosis not present

## 2021-11-19 DIAGNOSIS — R634 Abnormal weight loss: Secondary | ICD-10-CM | POA: Diagnosis not present

## 2021-11-19 DIAGNOSIS — K8689 Other specified diseases of pancreas: Secondary | ICD-10-CM | POA: Insufficient documentation

## 2021-11-19 DIAGNOSIS — R413 Other amnesia: Secondary | ICD-10-CM | POA: Insufficient documentation

## 2021-11-19 DIAGNOSIS — Z7189 Other specified counseling: Secondary | ICD-10-CM

## 2021-11-19 DIAGNOSIS — K7689 Other specified diseases of liver: Secondary | ICD-10-CM | POA: Insufficient documentation

## 2021-11-19 LAB — CBC WITH DIFFERENTIAL/PLATELET
Abs Immature Granulocytes: 0.07 10*3/uL (ref 0.00–0.07)
Basophils Absolute: 0.1 10*3/uL (ref 0.0–0.1)
Basophils Relative: 1 %
Eosinophils Absolute: 0 10*3/uL (ref 0.0–0.5)
Eosinophils Relative: 0 %
HCT: 47.1 % (ref 39.0–52.0)
Hemoglobin: 16.5 g/dL (ref 13.0–17.0)
Immature Granulocytes: 1 %
Lymphocytes Relative: 8 %
Lymphs Abs: 0.8 10*3/uL (ref 0.7–4.0)
MCH: 29.6 pg (ref 26.0–34.0)
MCHC: 35 g/dL (ref 30.0–36.0)
MCV: 84.4 fL (ref 80.0–100.0)
Monocytes Absolute: 0.6 10*3/uL (ref 0.1–1.0)
Monocytes Relative: 6 %
Neutro Abs: 8.5 10*3/uL — ABNORMAL HIGH (ref 1.7–7.7)
Neutrophils Relative %: 84 %
Platelets: 168 10*3/uL (ref 150–400)
RBC: 5.58 MIL/uL (ref 4.22–5.81)
RDW: 12.6 % (ref 11.5–15.5)
WBC: 10.1 10*3/uL (ref 4.0–10.5)
nRBC: 0 % (ref 0.0–0.2)

## 2021-11-19 LAB — COMPREHENSIVE METABOLIC PANEL
ALT: 16 U/L (ref 0–44)
AST: 16 U/L (ref 15–41)
Albumin: 4.2 g/dL (ref 3.5–5.0)
Alkaline Phosphatase: 112 U/L (ref 38–126)
Anion gap: 13 (ref 5–15)
BUN: 14 mg/dL (ref 8–23)
CO2: 30 mmol/L (ref 22–32)
Calcium: 9.3 mg/dL (ref 8.9–10.3)
Chloride: 92 mmol/L — ABNORMAL LOW (ref 98–111)
Creatinine, Ser: 1.06 mg/dL (ref 0.61–1.24)
GFR, Estimated: >60 mL/min (ref >60)
Glucose, Bld: 121 mg/dL — ABNORMAL HIGH (ref 70–99)
Potassium: 3.8 mmol/L (ref 3.5–5.1)
Sodium: 135 mmol/L (ref 135–145)
Total Bilirubin: 1.2 mg/dL (ref 0.3–1.2)
Total Protein: 7.9 g/dL (ref 6.5–8.1)

## 2021-11-19 LAB — CA 125

## 2021-11-19 LAB — VITAMIN B12: Vitamin B-12: 411 pg/mL (ref 180–914)

## 2021-11-19 LAB — FOLATE: Folate: 9.6 ng/mL (ref >5.9)

## 2021-11-19 MED ORDER — ONDANSETRON HCL 4 MG PO TABS: 4.0000 mg | ORAL_TABLET | Freq: Four times a day (QID) | ORAL | 0 refills | Status: AC | PRN

## 2021-11-19 NOTE — Progress Notes (Signed)
Hematology/Oncology Consult note [phone] [phone]      Patient Care Team: Juluis Pitch, MD as PCP - General (Family Medicine)  REFERRING PROVIDER: Juluis Pitch, MD  CHIEF COMPLAINTS/REASON FOR VISIT:  Evaluation of pancreatic mass  HISTORY OF PRESENTING ILLNESS:   Jeffrey Carr is a  70 y.o.  male with PMH listed below was seen in consultation at the request of  Juluis Pitch, MD  for evaluation of pancreatic mass.  Patient was recently seen by GI clinic at the request of PCP for evaluation of unintentional weight loss, poor appetite, nausea vomiting and indigestion.  Symptom onset was about 8 months ago. Patient had a stroke in 2017 and wife has noticed cognitive function decline. He weighed 164 pounds last year and currently he weighed 129 pounds-about 40 pounds over the past year.  Most of it coming over the past 6 months. Patient was tested positive for H. pylori IgA and was empirically treated for H. pylori with triple therapy regimen.  Patient reports that his symptoms got further worse after taking antibiotics.  Denies any hematochezia, hematemesis.  No fever or chills, night sweats.  Significant family history of cancer including father and cousin with stomach cancer.  Paternal uncle prostate cancer.  11/16/2021 CT chest abdomen pelvis with contrast showed pancreatic body/tail carcinoma with involvement of the duodenal junction.  This causes partial obstruction of the stomach and the proximal duodenum.  Small indeterminate liver lesions.  Involvement of the splenic artery and vein by tumor.  The SMV is uninvolved. 11/17/2021, MRI abdomen with and without contrast showed redemonstrated large palpable enhancing mass of pancreatic body and tail measuring 5.3 x 5.3 cm consistent with pancreatic adenocarcinoma.  Directly involve the adjacent greater curvature of the stomach as well as the duodenal jejunal junction.  Effacing the underlying splenic  vein and encasing the mid splenic artery.  Multiple subcentimeter liver lesions identified by previous CT are simple cyst by MRI.  No suspicious liver lesions.  No evidence of lymphadenopathy or metastatic disease within the abdomen.  Multiple subcentimeter simple hepatic cyst  Patient was accompanied by his wife today.  Review of Systems  Constitutional:  Positive for appetite change, fatigue and unexpected weight change. Negative for chills and fever.  HENT:   Negative for hearing loss and voice change.   Eyes:  Negative for eye problems and icterus.  Respiratory:  Negative for chest tightness, cough and shortness of breath.   Cardiovascular:  Negative for chest pain and leg swelling.  Gastrointestinal:  Positive for nausea. Negative for abdominal distention and abdominal pain.       Belching, flatulence  Endocrine: Negative for hot flashes.  Genitourinary:  Negative for difficulty urinating, dysuria and frequency.   Musculoskeletal:  Negative for arthralgias.  Skin:  Negative for itching and rash.  Neurological:  Negative for light-headedness and numbness.  Hematological:  Negative for adenopathy. Does not bruise/bleed easily.  Psychiatric/Behavioral:  Negative for confusion.        Memory loss   MEDICAL HISTORY:  Past Medical History:  Diagnosis Date   Left inguinal hernia 07/16/2012   Pancreatic mass    Sleep apnea 5-6 yrs ago   CPAP recommended-pt has but does not use.    SURGICAL HISTORY: Past Surgical History:  Procedure Laterality Date   COLONOSCOPY WITH PROPOFOL N/A 03/27/2017   Procedure: COLONOSCOPY WITH PROPOFOL;  Surgeon: Manya Silvas, MD;  Location: [facility] ENDOSCOPY;  Service: Endoscopy;  Laterality: N/A;   HAND SURGERY  12  years ago   [place]  07/31/2012   Procedure: HERNIA REPAIR INGUINAL ADULT;  Surgeon: Haywood Lasso, MD;  Location: [place];  Service: General;  Laterality: Left;   TONSILLECTOMY       SOCIAL HISTORY: Social History   Socioeconomic History   Marital status: Married    Spouse name: Not on file   Number of children: Not on file   Years of education: Not on file   Highest education level: Not on file  Occupational History   Not on file  Tobacco Use   Smoking status: Never   Smokeless tobacco: Never  Substance and Sexual Activity   Alcohol use: No    Comment: rare   Drug use: No   Sexual activity: Not Currently  Other Topics Concern   Not on file  Social History Narrative   Not on file   Social Determinants of Health   Financial Resource Strain: Not on file  Food Insecurity: Not on file  Transportation Needs: Not on file  Physical Activity: Not on file  Stress: Not on file  Social Connections: Not on file  Intimate Partner Violence: Not on file    FAMILY HISTORY: Family History  Problem Relation Age of Onset   Cancer Father        stomach   Prostate cancer Paternal Uncle     ALLERGIES:  has No Known Allergies.  MEDICATIONS:  Current Outpatient Medications  Medication Sig Dispense Refill   Sennosides (SENOKOT PO) Take by mouth.     atorvastatin (LIPITOR) 80 MG tablet Take 80 mg by mouth.     lisinopril (PRINIVIL,ZESTRIL) 20 MG tablet      ondansetron (ZOFRAN) 4 MG tablet Take 1 tablet (4 mg total) by mouth every 6 (six) hours as needed for nausea or vomiting. 60 tablet 0   sildenafil (REVATIO) 20 MG tablet Take 20 mg by mouth 3 (three) times daily.     No current facility-administered medications for this visit.     PHYSICAL EXAMINATION: ECOG PERFORMANCE STATUS: 2 - Symptomatic, <50% confined to bed Vitals:   11/18/21 1604  BP: 118/73  Pulse: 85  Temp: (!) 96 F (35.6 C)  SpO2: 99%   Filed Weights   11/18/21 1604  Weight: 129 lb 4.8 oz (58.7 kg)    Physical Exam Constitutional:      General: He is not in acute distress.    Comments: Patient sits in a wheel chair  HENT:     Head: Normocephalic and atraumatic.  Eyes:      General: No scleral icterus. Cardiovascular:     Rate and Rhythm: Normal rate and regular rhythm.     Heart sounds: Normal heart sounds. No murmur heard. Pulmonary:     Effort: Pulmonary effort is normal. No respiratory distress.     Breath sounds: No wheezing.  Abdominal:     General: Bowel sounds are normal. There is no distension.     Palpations: Abdomen is soft.  Musculoskeletal:        General: No deformity. Normal range of motion.     Cervical back: Normal range of motion and neck supple.  Skin:    General: Skin is warm and dry.     Findings: No erythema or rash.  Neurological:     Mental Status: He is alert and oriented to person, place, and time. Mental status is at baseline.     Cranial Nerves: No  cranial nerve deficit.     Coordination: Coordination normal.  Psychiatric:        Mood and Affect: Mood normal.    LABORATORY DATA:  I have reviewed the data as listed Lab Results  Component Value Date   WBC 10.1 11/19/2021   HGB 16.5 11/19/2021   HCT 47.1 11/19/2021   MCV 84.4 11/19/2021   PLT 168 11/19/2021   Recent Labs    11/16/21 1138 11/19/21 0920  NA  --  135  K  --  3.8  CL  --  92*  CO2  --  30  GLUCOSE  --  121*  BUN  --  14  CREATININE 1.40* 1.06  CALCIUM  --  9.3  GFRNONAA  --  >60  PROT  --  7.9  ALBUMIN  --  4.2  AST  --  16  ALT  --  16  ALKPHOS  --  112  BILITOT  --  1.2   Iron/TIBC/Ferritin/ %Sat No results found for: IRON, TIBC, FERRITIN, IRONPCTSAT    RADIOGRAPHIC STUDIES: I have personally reviewed the radiological images as listed and agreed with the findings in the report. MR ABDOMEN WWO CONTRAST  Result Date: 11/17/2021 CLINICAL DATA:  Weight loss, pancreatic mass, indeterminate liver lesions EXAM: MRI ABDOMEN WITHOUT AND WITH CONTRAST TECHNIQUE: Multiplanar multisequence MR imaging of the abdomen was performed both before and after the administration of intravenous contrast. CONTRAST:  7.43mL GADAVIST GADOBUTROL 1 MMOL/ML IV  SOLN COMPARISON:  CT chest abdomen pelvis, 11/16/2021 FINDINGS: Lower chest: No acute findings. Hepatobiliary: There are multiple subcentimeter, fluid signal, nonenhancing lesions of the left and right lobes of the liver, consistent with subcentimeter simple cysts, for example in the left lobe, hepatic segment II (series 4, image 8) and in the posterior liver dome, hepatic segment VII (series 4, image 10). No solid mass or other parenchymal abnormality identified. No gallstones. No biliary ductal dilatation. Pancreas: Redemonstrated large, hypoenhancing mass of the pancreatic body and tail, measuring 5.3 x 5.3 cm (series 19, image 39). This appears to directly involve the adjacent greater curvature of the stomach (series 19, image 26) as well as the duodenal jejunal junction (series 3, image 15, series 4, image 19). This mass appears to efface the underlying splenic vein and encase the mid splenic artery. Dilatation of the pancreatic duct in the tip of the pancreatic tail. Spleen:  Within normal limits in size and appearance. Adrenals/Urinary Tract: No masses identified. Numerous bilateral parapelvic cysts. No hydronephrosis. No evidence of hydronephrosis. Stomach/Bowel: Visualized portions within the abdomen are unremarkable. Vascular/Lymphatic: No pathologically enlarged lymph nodes identified. No abdominal aortic aneurysm demonstrated. Other:  None. Musculoskeletal: No suspicious bone lesions identified. IMPRESSION: 1. Redemonstrated large, hypoenhancing mass of the pancreatic body and tail, measuring 5.3 x 5.3 cm and consistent with pancreatic adenocarcinoma. 2. This appears to directly involve the adjacent greater curvature of the stomach as well as the duodenal jejunal junction, and appears to efface the underlying splenic vein and encase the mid splenic artery. 3. Multiple subcentimeter liver lesions identified by prior CT are simple cysts by MR. No suspicious liver lesions. 4. No evidence of lymphadenopathy  or metastatic disease within the abdomen. 5. Multiple subcentimeter simple hepatic cysts. Electronically Signed   By: Delanna Ahmadi M.D.   On: 11/17/2021 13:09   CT CHEST ABDOMEN PELVIS W CONTRAST  Result Date: 11/16/2021 CLINICAL DATA:  60 pound weight loss.  Poor historian. EXAM: CT CHEST, ABDOMEN, AND PELVIS WITH CONTRAST TECHNIQUE: Multidetector  CT imaging of the chest, abdomen and pelvis was performed following the standard protocol during bolus administration of intravenous contrast. CONTRAST:  52mL OMNIPAQUE IOHEXOL 300 MG/ML  SOLN COMPARISON:  None. FINDINGS: CT CHEST FINDINGS Cardiovascular: Aortic atherosclerosis. Normal heart size, without pericardial effusion. Lad and right coronary artery calcification. No central pulmonary embolism, on this non-dedicated study. Mediastinum/Nodes: No supraclavicular adenopathy. No mediastinal or hilar adenopathy. Lungs/Pleura: No pleural fluid. Left base scarring. Lower lung predominant subtle micronodularity is likely post infectious or inflammatory. Example in the left lower lobe on 123/3. No typical findings of pulmonary metastasis. Musculoskeletal: No acute osseous abnormality. Remote left twelfth rib fracture. CT ABDOMEN PELVIS FINDINGS Hepatobiliary: Scattered hypoattenuating liver lesions are too small to characterize. Normal gallbladder, without biliary ductal dilatation. Pancreas: Pancreatic body/tail junction heterogeneous mass is most consistent with adenocarcinoma. Example at 5.4 x 5.3 cm on 70/2. 6.8 cm on coronal image 54. Upstream atrophy and duct dilatation are relatively mild. No superimposed acute pancreatitis. Spleen: Normal in size, without focal abnormality. Adrenals/Urinary Tract: Normal adrenal glands. Bilateral renal sinus cysts, without hydronephrosis. Left renal cortical subcentimeter cyst. Normal urinary bladder. Stomach/Bowel: The stomach is mildly distended and fluid-filled. The duodenum is mild to moderately dilated to the level of the  pancreatic mass, where there is probable involvement of the duodenal/jejunal junction, including on 76/2 and coronal image 60. More distal small bowel is unremarkable. Normal colon and terminal ileum. Vascular/Lymphatic: Aortic atherosclerosis. Involvement of splenic artery and vein by tumor. Resultant gastroepiploic collaterals. The SMV is uninvolved. The splenoportal confluence may be contacted by tumor on 73/2. No abdominopelvic adenopathy. Reproductive: Normal prostate. Other: Right inguinal hernia repair. No significant free fluid. No evidence of omental or peritoneal disease. Musculoskeletal: Degenerative partial fusion of bilateral sacroiliac joints. IMPRESSION: 1. Pancreatic body/tail carcinoma with involvement of the duodenal jejunal junction. This causes partial obstruction of the stomach and proximal duodenum. 2. Too small to characterize liver lesions. If the patient is otherwise considered a surgical candidate, consider further evaluation with pre and post contrast abdominal MRI. 3. Vascular involvement by tumor as detailed above. 4.  No acute process or evidence of metastatic disease in the chest. 5. Coronary artery atherosclerosis. Aortic Atherosclerosis (ICD10-I70.0). These results will be called to the ordering clinician or representative by the Radiology Department at the imaging location. Electronically Signed   By: Abigail Miyamoto M.D.   On: 11/16/2021 12:07      ASSESSMENT & PLAN:  1. Pancreatic mass   2. Unintentional weight loss   3. Goals of care, counseling/discussion   4. Family history of cancer    #Pancreatic mass, CT images and MRI images were reviewed together with patient and her wife. Likely locally advanced pancreatic cancer.  Recommend biopsy to establish tissue diagnosis is Will ask RN navigator Kristi to coordinate the patient to have an EUS biopsy vs IR CT guided biopsy. Check CBC, CMP, CA 19.9  Memory loss, Cognitive function decline, check Folate and B12.   Unintentional weight loss, refer to nutritionist.   Family history of cancer, plan to refer to genetic counselor in the future.   Orders Placed This Encounter  Procedures   Comprehensive metabolic panel    Standing Status:   Future    Number of Occurrences:   1    Standing Expiration Date:   11/19/2022   CBC with Differential/Platelet    Standing Status:   Future    Number of Occurrences:   1    Standing Expiration Date:   11/19/2022  CA 125    Standing Status:   Future    Number of Occurrences:   1    Standing Expiration Date:   11/19/2022   CEA    Standing Status:   Future    Number of Occurrences:   1    Standing Expiration Date:   11/19/2022   Vitamin B12    Standing Status:   Future    Number of Occurrences:   1    Standing Expiration Date:   11/19/2022   Folate    Standing Status:   Future    Number of Occurrences:   1    Standing Expiration Date:   11/19/2022    All questions were answered. The patient knows to call the clinic with any problems questions or concerns.   Juluis Pitch, MD    Return of visit:  Thank you for this kind referral and the opportunity to participate in the care of this patient. A copy of today's note is routed to referring provider   Earlie Server, MD, PhD [facility] Hematology Oncology 11/19/2021

## 2021-11-19 NOTE — Progress Notes (Signed)
Pt states his stomach feels "funny" this morning, not pain.

## 2021-11-20 LAB — CEA: CEA: 1.9 ng/mL (ref 0.0–4.7)

## 2021-11-21 ENCOUNTER — Other Ambulatory Visit: Payer: Self-pay

## 2021-11-21 ENCOUNTER — Emergency Department: Payer: PPO

## 2021-11-21 ENCOUNTER — Inpatient Hospital Stay
Admission: EM | Admit: 2021-11-21 | Discharge: 2021-11-28 | DRG: 435 | Disposition: E | Payer: PPO | Attending: Internal Medicine | Admitting: Internal Medicine

## 2021-11-21 DIAGNOSIS — C7889 Secondary malignant neoplasm of other digestive organs: Secondary | ICD-10-CM | POA: Diagnosis present

## 2021-11-21 DIAGNOSIS — Z515 Encounter for palliative care: Secondary | ICD-10-CM

## 2021-11-21 DIAGNOSIS — C784 Secondary malignant neoplasm of small intestine: Secondary | ICD-10-CM | POA: Diagnosis present

## 2021-11-21 DIAGNOSIS — R1110 Vomiting, unspecified: Secondary | ICD-10-CM | POA: Diagnosis present

## 2021-11-21 DIAGNOSIS — C251 Malignant neoplasm of body of pancreas: Secondary | ICD-10-CM | POA: Diagnosis not present

## 2021-11-21 DIAGNOSIS — G4730 Sleep apnea, unspecified: Secondary | ICD-10-CM | POA: Diagnosis present

## 2021-11-21 DIAGNOSIS — Z8042 Family history of malignant neoplasm of prostate: Secondary | ICD-10-CM

## 2021-11-21 DIAGNOSIS — G893 Neoplasm related pain (acute) (chronic): Secondary | ICD-10-CM | POA: Diagnosis present

## 2021-11-21 DIAGNOSIS — K311 Adult hypertrophic pyloric stenosis: Secondary | ICD-10-CM | POA: Diagnosis present

## 2021-11-21 DIAGNOSIS — C258 Malignant neoplasm of overlapping sites of pancreas: Secondary | ICD-10-CM | POA: Diagnosis not present

## 2021-11-21 DIAGNOSIS — R112 Nausea with vomiting, unspecified: Secondary | ICD-10-CM | POA: Diagnosis not present

## 2021-11-21 DIAGNOSIS — R110 Nausea: Secondary | ICD-10-CM | POA: Diagnosis not present

## 2021-11-21 DIAGNOSIS — K869 Disease of pancreas, unspecified: Secondary | ICD-10-CM | POA: Diagnosis not present

## 2021-11-21 DIAGNOSIS — D72828 Other elevated white blood cell count: Secondary | ICD-10-CM | POA: Diagnosis present

## 2021-11-21 DIAGNOSIS — Z66 Do not resuscitate: Secondary | ICD-10-CM | POA: Diagnosis present

## 2021-11-21 DIAGNOSIS — G9340 Encephalopathy, unspecified: Secondary | ICD-10-CM | POA: Diagnosis present

## 2021-11-21 DIAGNOSIS — R1111 Vomiting without nausea: Secondary | ICD-10-CM | POA: Diagnosis not present

## 2021-11-21 DIAGNOSIS — E876 Hypokalemia: Secondary | ICD-10-CM | POA: Diagnosis not present

## 2021-11-21 DIAGNOSIS — Z800 Family history of malignant neoplasm of digestive organs: Secondary | ICD-10-CM | POA: Diagnosis not present

## 2021-11-21 DIAGNOSIS — K8689 Other specified diseases of pancreas: Secondary | ICD-10-CM | POA: Diagnosis not present

## 2021-11-21 DIAGNOSIS — R64 Cachexia: Secondary | ICD-10-CM | POA: Diagnosis present

## 2021-11-21 DIAGNOSIS — I10 Essential (primary) hypertension: Secondary | ICD-10-CM | POA: Diagnosis present

## 2021-11-21 DIAGNOSIS — Z681 Body mass index (BMI) 19 or less, adult: Secondary | ICD-10-CM | POA: Diagnosis not present

## 2021-11-21 DIAGNOSIS — E870 Hyperosmolality and hypernatremia: Secondary | ICD-10-CM | POA: Diagnosis not present

## 2021-11-21 DIAGNOSIS — K920 Hematemesis: Secondary | ICD-10-CM

## 2021-11-21 DIAGNOSIS — E43 Unspecified severe protein-calorie malnutrition: Secondary | ICD-10-CM | POA: Diagnosis present

## 2021-11-21 DIAGNOSIS — E785 Hyperlipidemia, unspecified: Secondary | ICD-10-CM | POA: Diagnosis present

## 2021-11-21 DIAGNOSIS — R54 Age-related physical debility: Secondary | ICD-10-CM | POA: Diagnosis present

## 2021-11-21 DIAGNOSIS — Z8673 Personal history of transient ischemic attack (TIA), and cerebral infarction without residual deficits: Secondary | ICD-10-CM | POA: Diagnosis not present

## 2021-11-21 DIAGNOSIS — R9431 Abnormal electrocardiogram [ECG] [EKG]: Secondary | ICD-10-CM | POA: Diagnosis not present

## 2021-11-21 DIAGNOSIS — K226 Gastro-esophageal laceration-hemorrhage syndrome: Secondary | ICD-10-CM | POA: Diagnosis present

## 2021-11-21 DIAGNOSIS — Z452 Encounter for adjustment and management of vascular access device: Secondary | ICD-10-CM | POA: Diagnosis not present

## 2021-11-21 DIAGNOSIS — Z20822 Contact with and (suspected) exposure to covid-19: Secondary | ICD-10-CM | POA: Diagnosis present

## 2021-11-21 DIAGNOSIS — R918 Other nonspecific abnormal finding of lung field: Secondary | ICD-10-CM | POA: Diagnosis not present

## 2021-11-21 DIAGNOSIS — C259 Malignant neoplasm of pancreas, unspecified: Principal | ICD-10-CM | POA: Diagnosis present

## 2021-11-21 DIAGNOSIS — Z789 Other specified health status: Secondary | ICD-10-CM | POA: Diagnosis not present

## 2021-11-21 DIAGNOSIS — Z95828 Presence of other vascular implants and grafts: Secondary | ICD-10-CM

## 2021-11-21 DIAGNOSIS — R000 Tachycardia, unspecified: Secondary | ICD-10-CM | POA: Diagnosis not present

## 2021-11-21 HISTORY — DX: Unilateral inguinal hernia, without obstruction or gangrene, not specified as recurrent: K40.90

## 2021-11-21 HISTORY — DX: Sleep apnea, unspecified: G47.30

## 2021-11-21 HISTORY — DX: Malignant (primary) neoplasm, unspecified: C80.1

## 2021-11-21 HISTORY — DX: Cerebral infarction, unspecified: I63.9

## 2021-11-21 LAB — SAMPLE TO BLOOD BANK

## 2021-11-21 LAB — CBC
HCT: 48.6 % (ref 39.0–52.0)
Hemoglobin: 16.5 g/dL (ref 13.0–17.0)
MCH: 28.8 pg (ref 26.0–34.0)
MCHC: 34 g/dL (ref 30.0–36.0)
MCV: 85 fL (ref 80.0–100.0)
Platelets: 213 10*3/uL (ref 150–400)
RBC: 5.72 MIL/uL (ref 4.22–5.81)
RDW: 13.1 % (ref 11.5–15.5)
WBC: 14.5 10*3/uL — ABNORMAL HIGH (ref 4.0–10.5)
nRBC: 0 % (ref 0.0–0.2)

## 2021-11-21 LAB — COMPREHENSIVE METABOLIC PANEL
ALT: 17 U/L (ref 0–44)
AST: 17 U/L (ref 15–41)
Albumin: 3.9 g/dL (ref 3.5–5.0)
Alkaline Phosphatase: 95 U/L (ref 38–126)
Anion gap: 15 (ref 5–15)
BUN: 16 mg/dL (ref 8–23)
CO2: 29 mmol/L (ref 22–32)
Calcium: 9.5 mg/dL (ref 8.9–10.3)
Chloride: 95 mmol/L — ABNORMAL LOW (ref 98–111)
Creatinine, Ser: 1.24 mg/dL (ref 0.61–1.24)
GFR, Estimated: >60 mL/min (ref >60)
Glucose, Bld: 137 mg/dL — ABNORMAL HIGH (ref 70–99)
Potassium: 3.5 mmol/L (ref 3.5–5.1)
Sodium: 139 mmol/L (ref 135–145)
Total Bilirubin: 1.6 mg/dL — ABNORMAL HIGH (ref 0.3–1.2)
Total Protein: 7.8 g/dL (ref 6.5–8.1)

## 2021-11-21 LAB — LIPASE, BLOOD: Lipase: 87 U/L — ABNORMAL HIGH (ref 11–51)

## 2021-11-21 MED ORDER — DROPERIDOL 2.5 MG/ML IJ SOLN
2.5000 mg | Freq: Once | INTRAMUSCULAR | Status: AC
  Administered 2021-11-21: 2.5 mg via INTRAVENOUS
  Filled 2021-11-21: qty 2

## 2021-11-21 NOTE — ED Triage Notes (Signed)
Pt arrives via [facility] from home with CC of vomiting black emesis that started today at 5pm. Per EMS - recent diagnosis of metastasizing pancreatic cancer.

## 2021-11-21 NOTE — ED Provider Notes (Signed)
[facility] Emergency Department Provider Note  ____________________________________________   Event Date/Time   First MD Initiated Contact with Patient 11/04/2021 2305     (approximate)  I have reviewed the triage vital signs and the nursing notes.   HISTORY  Chief Complaint Emesis    HPI Jeffrey Carr is a 70 y.o. male  who reportedly was recently diagnosed with pancreatic cancer and sees Dr. Tasia Catchings at the cancer center (and [facility] for primary care).  and presents tonight by EMS for evaluation of vomiting.  Patient reports starting vomiting earlier today and is vomited at least 10 times.  What prompted her to call EMS was that he saw some blood in the vomit.  Symptoms have been severe and relatively acute in onset today although this is not a new problem for him.  He said that he has some pain when he vomits but just lying still right now he has no abdominal pain.  He denies recent fever, chest pain, shortness of breath, and lower abdominal pain.  Nothing particular makes his symptoms better or worse.  He tried taking Zofran earlier tonight around 6 PM but he believes that he vomited that up as well.     Past Medical History:  Diagnosis Date   Cancer [facility])    pancreatic mass   Left inguinal hernia    Sleep apnea    Stroke [facility])    reportedly had intracranial bleed    There are no problems to display for this patient.   Past Surgical History:  Procedure Laterality Date   BRAIN SURGERY      Prior to Admission medications   Not on File    Allergies Patient has no known allergies.  History reviewed. No pertinent family history.  Social History Social History   Tobacco Use   Smoking status: Never   Smokeless tobacco: Never  Substance Use Topics   Alcohol use: Not Currently   Drug use: Not Currently    Review of Systems Constitutional: No fever/chills Eyes: No visual changes. ENT: No sore throat. Cardiovascular: Denies chest  pain. Respiratory: Denies shortness of breath. Gastrointestinal: Persistent vomiting today, possibly some blood in the emesis.  Intermittent upper abdominal pain, not currently. Genitourinary: Negative for dysuria. Musculoskeletal: Negative for neck pain.  Negative for back pain. Integumentary: Negative for rash. Neurological: Negative for headaches, focal weakness or numbness.   ____________________________________________   PHYSICAL EXAM:  VITAL SIGNS: ED Triage Vitals  Enc Vitals Group     BP 11/09/2021 2258 (!) 136/91     Pulse Rate 11/25/2021 2258 89     Resp 11/02/2021 2258 18     Temp 11/22/2021 2258 98.4 F (36.9 C)     Temp Source 11/03/2021 2258 Oral     SpO2 11/03/2021 2258 98 %     Weight 11/03/2021 2259 62 kg (136 lb 11 oz)     Height 11/15/2021 2259 1.88 m (6\' 2" )     Head Circumference --      Peak Flow --      Pain Score 11/20/2021 2259 0     Pain Loc --      Pain Edu? --      Excl. in [place]? --     Constitutional: Alert and oriented.  Appears chronically ill-appearing but not in distress currently. Eyes: Normal conjunctivitis. Head: Atraumatic. Nose: No congestion/rhinnorhea. Mouth/Throat: Patient is wearing a mask. Neck: No stridor.  No meningeal signs.   Cardiovascular: Normal rate, regular rhythm. Good peripheral  circulation. Respiratory: Normal respiratory effort.  No retractions. Gastrointestinal: Somewhat cachectic, no distention, no tenderness to palpation of the abdomen at this time.  Recently vomited. Musculoskeletal: No lower extremity tenderness nor edema. No gross deformities of extremities. Neurologic:  Normal speech and language. No gross focal neurologic deficits are appreciated.  Skin:  Skin is warm, dry and intact.  Appears jaundiced. Psychiatric: Mood and affect are normal. Speech and behavior are normal.  ____________________________________________   LABS (all labs ordered are listed, but only abnormal results are displayed)  Labs Reviewed   LIPASE, BLOOD - Abnormal; Notable for the following components:      Result Value   Lipase 87 (*)    All other components within normal limits  COMPREHENSIVE METABOLIC PANEL - Abnormal; Notable for the following components:   Chloride 95 (*)    Glucose, Bld 137 (*)    Total Bilirubin 1.6 (*)    All other components within normal limits  CBC - Abnormal; Notable for the following components:   WBC 14.5 (*)    All other components within normal limits  MAGNESIUM - Abnormal; Notable for the following components:   Magnesium 2.5 (*)    All other components within normal limits  RESP PANEL BY RT-PCR (FLU A&B, COVID) ARPGX2  LACTIC ACID, PLASMA  SAMPLE TO BLOOD BANK  TROPONIN I (HIGH SENSITIVITY)   ____________________________________________  EKG  ED ECG REPORT I, Hinda Kehr, the attending physician, personally viewed and interpreted this ECG.  Date: 11/13/2021 EKG Time: 23:02 Rate: 89 Rhythm: normal sinus rhythm QRS Axis: normal Intervals: normal ST/T Wave abnormalities: Non-specific ST segment / T-wave changes, but no clear evidence of acute ischemia. Narrative Interpretation: no definitive evidence of acute ischemia; does not meet STEMI criteria.  ____________________________________________  RADIOLOGY I, Hinda Kehr, personally viewed and evaluated these images (plain radiographs) as part of my medical decision making, as well as reviewing the written report by the radiologist.  ED MD interpretation:  No acute abnormaltities on abdomen series  Official radiology report(s): DG Abdomen Acute W/Chest  Result Date: 11/22/2021 CLINICAL DATA:  Vomiting EXAM: DG ABDOMEN ACUTE WITH 1 VIEW CHEST COMPARISON:  None. FINDINGS: There is no evidence of dilated bowel loops or free intraperitoneal air. No radiopaque calculi or other significant radiographic abnormality is seen. Heart size and mediastinal contours are within normal limits. Both lungs are clear. IMPRESSION: Negative  abdominal radiographs.  No acute cardiopulmonary disease. Electronically Signed   By: Ulyses Jarred M.D.   On: 11/22/2021 00:46    ____________________________________________   PROCEDURES   Procedure(s) performed (including Critical Care):  .1-3 Lead EKG Interpretation Performed by: Hinda Kehr, MD Authorized by: Hinda Kehr, MD     Interpretation: normal     ECG rate:  88   ECG rate assessment: normal     Rhythm: sinus rhythm     Ectopy: none     Conduction: normal     ____________________________________________   INITIAL IMPRESSION / MDM / ASSESSMENT AND PLAN / ED COURSE  As part of my medical decision making, I reviewed the following data within the electronic MEDICAL RECORD NUMBER History obtained from family, Nursing notes reviewed and incorporated, Labs reviewed , EKG interpreted , Old chart reviewed, Radiograph reviewed , Discussed with admitting physician , and Notes from prior ED visits   Differential diagnosis includes, but is not limited to, acute on chronic symptoms related to pancreatic mass and probable pancreatic cancer, biliary obstruction, sepsis, SBO/ileus.  Less likely cardiac disease.  The  patient is on the cardiac monitor to evaluate for evidence of arrhythmia and/or significant heart rate changes.  EKG computer reported concern for ischemia but this is not consistent clinically nor MI concerned about ischemia on the EKG interpretation.  I am ordering a troponin to see if this is an anginal equivalent but I think it is unlikely.  I reviewed the clinic note from Dr. Tasia Catchings from December 23 as well as the radiology report of his abdominal MRI confirming a large pancreatic mass and probable pancreatic cancer and a plan for biopsy.  Patient's QTC is right around 450 ms but he is not getting relief from Zofran and I ordered droperidol 2.5 mg IV to help with his primary symptom which is nausea and vomiting.  I ordered 1 L LR IV bolus.  He is not in any pain at this  time and I will hold off on pain medication.  Acute abdomen series of x-rays to evaluate for any evidence of obstruction; the patient has had recent CT scans and abdominal MRIs and I doubt that a CT scan of the abdomen and pelvis would be helpful at this point unless there is evidence of a new or acute issue that requires CT for diagnosis.  Labs notable for a normal lactic acid, CBC with leukocytosis of 14.5 which is likely reactive, mildly elevated lipase, and essentially normal comprehensive metabolic panel with a T bili of only 1.6 and a normal creatinine.  High-sensitivity troponin is 8.  Clinical Course as of 11/22/21 0238  Mon Nov 22, 2021  0215 I personally reviewed the patient's imaging and agree with the radiologist's interpretation that there are no acute abnormalities on the acute abdomen series with chest x-ray. [CF]  0218 Checked on patient, he says he feels more nauseated than he did earlier and is having generalized aching abdominal pain.  He has not able to take anything by mouth.  I discussed extensively with his wife the current plan.  She said he has not been able to tolerate anything today and she does not think she can take care of him at home right now.  I will admit for fluids and treatment of intractable nausea and vomiting in the setting of a pancreatic mass.  I also had an extensive discussion with the wife about long-term goals and she would like to have a palliative care consult while in the hospital to discuss alternatives to aggressive treatment through oncology.  I think this is very reasonable and I will discuss this with the hospitalist.  For his current worsening symptoms, I ordered Reglan 10 mg IV and morphine 4 mg IV. [CF]  0236 Discussed case by phone with Dr. Damita Dunnings with the hospitalist service and she will admit the patient. [CF]    Clinical Course User Index [CF] Hinda Kehr, MD     ____________________________________________  FINAL CLINICAL  IMPRESSION(S) / ED DIAGNOSES  Final diagnoses:  Hematemesis with nausea  Intractable nausea and vomiting     MEDICATIONS GIVEN DURING THIS VISIT:  Medications  droperidol (INAPSINE) 2.5 MG/ML injection 2.5 mg (2.5 mg Intravenous Given 11/10/2021 2342)  lactated ringers bolus 1,000 mL (0 mLs Intravenous Stopped 11/22/21 0226)  metoCLOPramide (REGLAN) injection 10 mg (10 mg Intravenous Given 11/22/21 0226)  morphine 4 MG/ML injection 4 mg (4 mg Intravenous Given 11/22/21 0228)     ED Discharge Orders     None        Note:  This document was prepared using Dragon voice recognition software  and may include unintentional dictation errors.   Hinda Kehr, MD 11/22/21 [phone]

## 2021-11-22 ENCOUNTER — Encounter: Payer: Self-pay | Admitting: Emergency Medicine

## 2021-11-22 DIAGNOSIS — K920 Hematemesis: Secondary | ICD-10-CM | POA: Diagnosis not present

## 2021-11-22 DIAGNOSIS — R1110 Vomiting, unspecified: Secondary | ICD-10-CM | POA: Diagnosis present

## 2021-11-22 DIAGNOSIS — K8689 Other specified diseases of pancreas: Secondary | ICD-10-CM | POA: Diagnosis not present

## 2021-11-22 LAB — CBC
HCT: 44.4 % (ref 39.0–52.0)
Hemoglobin: 15.2 g/dL (ref 13.0–17.0)
MCH: 28.9 pg (ref 26.0–34.0)
MCHC: 34.2 g/dL (ref 30.0–36.0)
MCV: 84.4 fL (ref 80.0–100.0)
Platelets: 199 10*3/uL (ref 150–400)
RBC: 5.26 MIL/uL (ref 4.22–5.81)
RDW: 13 % (ref 11.5–15.5)
WBC: 14.9 10*3/uL — ABNORMAL HIGH (ref 4.0–10.5)
nRBC: 0 % (ref 0.0–0.2)

## 2021-11-22 LAB — RESP PANEL BY RT-PCR (FLU A&B, COVID) ARPGX2
Influenza A by PCR: NEGATIVE
Influenza B by PCR: NEGATIVE
SARS Coronavirus 2 by RT PCR: NEGATIVE

## 2021-11-22 LAB — BASIC METABOLIC PANEL
Anion gap: 16 — ABNORMAL HIGH (ref 5–15)
BUN: 18 mg/dL (ref 8–23)
CO2: 26 mmol/L (ref 22–32)
Calcium: 8.9 mg/dL (ref 8.9–10.3)
Chloride: 100 mmol/L (ref 98–111)
Creatinine, Ser: 1.11 mg/dL (ref 0.61–1.24)
GFR, Estimated: >60 mL/min (ref >60)
Glucose, Bld: 131 mg/dL — ABNORMAL HIGH (ref 70–99)
Potassium: 3.7 mmol/L (ref 3.5–5.1)
Sodium: 142 mmol/L (ref 135–145)

## 2021-11-22 LAB — TROPONIN I (HIGH SENSITIVITY): Troponin I (High Sensitivity): 8 ng/L (ref <18)

## 2021-11-22 LAB — CANCER ANTIGEN 19-9: CA 19-9: 463 U/mL — ABNORMAL HIGH (ref 0–35)

## 2021-11-22 LAB — MAGNESIUM: Magnesium: 2.5 mg/dL — ABNORMAL HIGH (ref 1.7–2.4)

## 2021-11-22 LAB — LACTIC ACID, PLASMA: Lactic Acid, Venous: 1.5 mmol/L (ref 0.5–1.9)

## 2021-11-22 MED ORDER — MORPHINE SULFATE (PF) 4 MG/ML IV SOLN
4.0000 mg | Freq: Once | INTRAVENOUS | Status: AC
  Administered 2021-11-22: 4 mg via INTRAVENOUS
  Filled 2021-11-22: qty 1

## 2021-11-22 MED ORDER — METOCLOPRAMIDE HCL 5 MG/ML IJ SOLN
10.0000 mg | INTRAMUSCULAR | Status: AC
  Administered 2021-11-22: 10 mg via INTRAVENOUS
  Filled 2021-11-22: qty 2

## 2021-11-22 MED ORDER — HYDRALAZINE HCL 50 MG PO TABS: 25.0000 mg | ORAL_TABLET | Freq: Three times a day (TID) | ORAL | Status: DC | PRN

## 2021-11-22 MED ORDER — LACTATED RINGERS IV BOLUS
1000.0000 mL | Freq: Once | INTRAVENOUS | Status: AC
  Administered 2021-11-22: 1000 mL via INTRAVENOUS

## 2021-11-22 MED ORDER — ACETAMINOPHEN 650 MG RE SUPP: 650.0000 mg | Freq: Four times a day (QID) | RECTAL | Status: DC | PRN

## 2021-11-22 MED ORDER — PROCHLORPERAZINE EDISYLATE 10 MG/2ML IJ SOLN
10.0000 mg | Freq: Four times a day (QID) | INTRAMUSCULAR | Status: DC | PRN
  Administered 2021-11-23: 10 mg via INTRAVENOUS
  Filled 2021-11-22 (×2): qty 2

## 2021-11-22 MED ORDER — PANTOPRAZOLE SODIUM 40 MG IV SOLR
40.0000 mg | Freq: Two times a day (BID) | INTRAVENOUS | Status: DC
  Administered 2021-11-22 – 2021-11-27 (×10): 40 mg via INTRAVENOUS
  Filled 2021-11-22 (×10): qty 40

## 2021-11-22 MED ORDER — HYDRALAZINE HCL 20 MG/ML IJ SOLN: 5.0000 mg | Freq: Three times a day (TID) | INTRAMUSCULAR | Status: DC | PRN

## 2021-11-22 MED ORDER — ACETAMINOPHEN 325 MG PO TABS: 650.0000 mg | ORAL_TABLET | Freq: Four times a day (QID) | ORAL | Status: DC | PRN

## 2021-11-22 MED ORDER — PANTOPRAZOLE SODIUM 40 MG IV SOLR
40.0000 mg | INTRAVENOUS | Status: DC
  Administered 2021-11-22: 40 mg via INTRAVENOUS

## 2021-11-22 MED ORDER — ONDANSETRON HCL 4 MG/2ML IJ SOLN
4.0000 mg | Freq: Four times a day (QID) | INTRAMUSCULAR | Status: DC | PRN
  Administered 2021-11-22 – 2021-11-25 (×7): 4 mg via INTRAVENOUS
  Filled 2021-11-22 (×8): qty 2

## 2021-11-22 MED ORDER — ONDANSETRON HCL 4 MG PO TABS: 4.0000 mg | ORAL_TABLET | Freq: Four times a day (QID) | ORAL | Status: DC | PRN

## 2021-11-22 MED ORDER — PANTOPRAZOLE SODIUM 40 MG IV SOLR: 40.0000 mg | Freq: Two times a day (BID) | INTRAVENOUS | Status: DC

## 2021-11-22 MED ORDER — LACTATED RINGERS IV SOLN: INTRAVENOUS | Status: DC

## 2021-11-22 MED ORDER — MORPHINE SULFATE (PF) 2 MG/ML IV SOLN
2.0000 mg | INTRAVENOUS | Status: DC | PRN
  Administered 2021-11-25: 2 mg via INTRAVENOUS
  Filled 2021-11-22: qty 1

## 2021-11-22 NOTE — Consult Note (Addendum)
Vonda Antigua, MD [address] [place], [place], [place], [address] [address], [place], [place], [place], [address] Phone: [phone]  Fax: [phone]  Consultation  Referring Provider:     Dr. Karleen Hampshire Primary Care Physician:  Juluis Pitch, MD Reason for Consultation:     Hematemesis  Date of Admission:  10/29/2021 Date of Consultation:  11/22/2021         HPI:   Jeffrey Carr is a 70 y.o. male who presents with nausea vomiting and hematemesis at home.  Patient is laying in bed and appears tired and lethargic, but does answer questions and is awake and alert.  Wife helps provide history.  Patient was recently seen by [facility] GI, and oncology due to weight loss, nausea vomiting and diagnosed with pancreatic mass on imaging and was able awaiting EUS for tissue diagnosis.  Dr. Collie Siad and [facility] notes reviewed and his other chart.  Wife reports that patient has intermittent nausea vomiting at home and has been vomiting for the last 3 days at home.  However, as of last night he has had hematemesis and episodes prior to last night did not have any blood in it.  Last episode was early this morning.  No melena.  No prior upper endoscopy.  Past Medical History:  Diagnosis Date   Cancer [facility])    pancreatic mass   Left inguinal hernia    Sleep apnea    Stroke [facility])    reportedly had intracranial bleed    Past Surgical History:  Procedure Laterality Date   BRAIN SURGERY      Prior to Admission medications   Medication Sig Start Date End Date Taking? Authorizing Provider  ondansetron (ZOFRAN) 4 MG tablet Take 4 mg by mouth every 8 (eight) hours as needed for nausea.   Yes Geanie Kenning, PA-C  omeprazole (PRILOSEC) 20 MG capsule Take 20 mg by mouth 2 (two) times daily. For 30 days Patient not taking: Reported on 11/22/2021    Juluis Pitch, MD    Family history: Stomach cancer Father, prostate cancer uncle  Social History   Tobacco Use    Smoking status: Never   Smokeless tobacco: Never  Substance Use Topics   Alcohol use: Not Currently   Drug use: Not Currently    Allergies as of 11/10/2021   (No Known Allergies)    Review of Systems:    All systems reviewed and negative except where noted in HPI.   Physical Exam:  Constitutional: General:   Alert,  Well-developed, well-nourished, pleasant and cooperative in NAD BP (!) 166/84    Pulse 79    Temp 98 F (36.7 C) (Oral)    Resp 18    Ht 6\' 2"  (1.88 m)    Wt 62 kg    SpO2 98%    BMI 17.55 kg/m   Eyes:  Sclera clear, no icterus.   Conjunctiva pink. PERRLA  Ears:  No scars, lesions or masses, Normal auditory acuity. Nose:  No deformity, discharge, or lesions. Mouth:  No deformity or lesions, oropharynx pink & moist.  Neck:  Supple; no masses or thyromegaly.  Respiratory: Normal respiratory effort, Normal percussion  Gastrointestinal:  Normal bowel sounds.  No bruits.  Soft, non-tender and non-distended without masses, hepatosplenomegaly or hernias noted.  No guarding or rebound tenderness.     Cardiac: No clubbing or edema.  No cyanosis. Normal posterior tibial pedal pulses noted.  Lymphatic:  No significant cervical or axillary adenopathy.  Psych:  Alert and cooperative. Normal mood and affect.  Musculoskeletal:  Normal gait. Head normocephalic, atraumatic. Symmetrical without gross deformities. 5/5 Upper and Lower extremity strength bilaterally.  Skin: Warm. Intact without significant lesions or rashes. No jaundice.  Neurologic:  Face symmetrical, tongue midline, Normal sensation to touch;  grossly normal neurologically.  Psych:  Alert and oriented x3, Alert and cooperative. Normal mood and affect.   LAB RESULTS: Recent Labs    11/07/2021 2304 11/22/21 0623  WBC 14.5* 14.9*  HGB 16.5 15.2  HCT 48.6 44.4  PLT 213 199   BMET Recent Labs    10/31/2021 2304 11/22/21 0623  NA 139 142  K 3.5 3.7  CL 95* 100  CO2 29 26  GLUCOSE 137* 131*  BUN 16  18  CREATININE 1.24 1.11  CALCIUM 9.5 8.9   LFT Recent Labs    10/30/2021 2304  PROT 7.8  ALBUMIN 3.9  AST 17  ALT 17  ALKPHOS 95  BILITOT 1.6*   PT/INR No results for input(s): LABPROT, INR in the last 72 hours.  STUDIES: DG Abdomen Acute W/Chest  Result Date: 11/22/2021 CLINICAL DATA:  Vomiting EXAM: DG ABDOMEN ACUTE WITH 1 VIEW CHEST COMPARISON:  None. FINDINGS: There is no evidence of dilated bowel loops or free intraperitoneal air. No radiopaque calculi or other significant radiographic abnormality is seen. Heart size and mediastinal contours are within normal limits. Both lungs are clear. IMPRESSION: Negative abdominal radiographs.  No acute cardiopulmonary disease. Electronically Signed   By: Ulyses Jarred M.D.   On: 11/22/2021 00:46    MRI abdomen, available in his other chart IMPRESSION: 1. Redemonstrated large, hypoenhancing mass of the pancreatic body and tail, measuring 5.3 x 5.3 cm and consistent with pancreatic adenocarcinoma. 2. This appears to directly involve the adjacent greater curvature of the stomach as well as the duodenal jejunal junction, and appears to efface the underlying splenic vein and encase the mid splenic artery. 3. Multiple subcentimeter liver lesions identified by prior CT are simple cysts by MR. No suspicious liver lesions. 4. No evidence of lymphadenopathy or metastatic disease within the abdomen. 5. Multiple subcentimeter simple hepatic cysts.     Electronically Signed   By: Delanna Ahmadi M.D.   On: 11/17/2021 13:09   Impression / Plan:   Jeffrey Carr is a 70 y.o. y/o male with recently diagnosed pancreatic mass on imaging, with intermittent nausea vomiting at home recently, with hematemesis that started last night with a normal hemoglobin  Given that his hemoglobin is normal, hematemesis is likely related to Mallory-Weiss tear from intermittent nausea vomiting since he has been diagnosed with pancreatic mass that is involving the  stomach as well (hematemesis from peptic ulcer disease would likely cause a decrease in his hemoglobin and hemodynamic changes in clinical status, which is not present at this time)  He has not had any further episodes of emesis since early this morning and none in the ER.  No indication for emergent EGD in this setting, and would recommend continued medical optimization at this time  PPI IV twice daily  Continue serial CBCs and transfuse PRN Avoid NSAIDs Maintain 2 large-bore IV lines Please page GI with any acute hemodynamic changes, or signs of active GI bleeding  He was awaiting EUS as an outpatient for tissue diagnosis  Oncology has been consulted and so has palliative care.  Possible EGD depending on clinical status, repeat hemoglobin, and patient family wishes.    Will continue to assess and determine further  plan of care and need for EGD based on symptoms  Thank you for involving me in the care of this patient.      LOS: 0 days   Virgel Manifold, MD  11/22/2021, 5:15 PM

## 2021-11-22 NOTE — Progress Notes (Signed)
Pt seen and examined with family at bedside.    Jeffrey Carr is a 70 y.o. male with medical history significant for Recently diagnosed pancreatic mass, likely locally advanced pancreatic cancer, seen by oncology on 12/23 with plans for biopsy to establish tissue diagnosis, who presents to the ED for evaluation of vomiting and hematemesis.   Pt admitted by Dr Damita Dunnings earlier this am please note for detailed H&P.   GI consulted for hematemesis. No more episodes of hematemesis this morning.  Symptomatic management with IV fluids and IV anti emetics.  Added PPI BID.  Palliative care consulted for goals of care.  Oncology will be consulted in am.    Hosie Poisson, MD

## 2021-11-22 NOTE — H&P (Addendum)
History and Physical    Jeffrey Carr YHC:[id number] DOB: [birth year] DOA: 11/20/2021  PCP: Juluis Pitch, MD   Patient coming from: home  I have personally briefly reviewed patient's relevant medical records in [place]  Chief Complaint: vomiting  HPI: Jeffrey Carr is a 70 y.o. male with medical history significant for Recently diagnosed pancreatic mass, likely locally advanced pancreatic cancer, seen by oncology on 12/23 with plans for biopsy to establish tissue diagnosis, who presents to the ED for evaluation of vomiting, that started earlier in the day.  It was initially nonbloody and nonbilious but then appeared to have some blood.  He had about 10 episodes.  This is associated with abdominal pain when vomiting.  He denies diarrhea, fever or chills.  He tried taking Zofran but vomited it up as well.  Patient had been losing weight for the past several months until work-up revealed pancreatic mass around 12/20.  ED course: BP 136/91 with otherwise normal vitals CMP with slightly elevated bilirubin of 1.6 otherwise unremarkable.  Lipase 87 WBC 14.5 and hemoglobin 16.5.  Lactic acid 1.5  EKG, personally viewed and interpreted: Sinus rhythm at 89 with nonspecific ST-T wave changes  Imaging: Acute abdominal series.  Negative abdominal radiographs no acute cardiopulmonary disease  Patient treated with IV fluid bolus, droperidol, metoclopramide and morphine.  Hospitalist consulted for admission. The ED provider spoke with patient and his wife about goals of care and they are open to palliative care consult.   Review of Systems: As per HPI otherwise all other systems on review of systems negative.   Assessment/Plan    Intractable vomiting   Pancreatic mass pending work up - Intractable vomiting, likely related to pancreatic mass - IV hydration, IV Protonix, IV antiemetics, IV pain control -Keep n.p.o. for now - Palliative care consult may be appropriate. Wife states she would  not want him to suffer  Possible hematemesis - Suspect Mallory-Weiss tear - Serial H&H  Protein calorie malnutrition, severe - Patient has had a 40 pound weight loss in 6 months - Suspect related to cachexia of suspected cancer - Nutritionist evaluation  DVT prophylaxis: Lovenox  Code Status: full code  Family Communication: Spoke with wife at bedside Disposition Plan: Back to previous home environment Consults called: none  Status:observation    Physical Exam: Vitals:   11/22/21 0030 11/22/21 0100 11/22/21 0130 11/22/21 0200  BP: 105/70 112/69 113/77 130/76  Pulse: 93 68 85 85  Resp: 13 15 18 16   Temp:      TempSrc:      SpO2: 94% 95% 94% 94%  Weight:      Height:       Constitutional: Alert, oriented x 3 .  Cachectic appearing.  Not in any apparent distress HEENT:      Head: Normocephalic and atraumatic.         Eyes: PERLA, EOMI, Conjunctivae are normal. Sclera is non-icteric.       Mouth/Throat: Mucous membranes are moist.       Neck: Supple with no signs of meningismus. Cardiovascular: Regular rate and rhythm. No murmurs, gallops, or rubs. 2+ symmetrical distal pulses are present . No JVD. No  LE edema Respiratory: Respiratory effort normal .Lungs sounds clear bilaterally. No wheezes, crackles, or rhonchi.  Gastrointestinal: Soft, non tender, non distended. Positive bowel sounds.  Genitourinary: No CVA tenderness. Musculoskeletal: Nontender with normal range of motion in all extremities. No cyanosis, or erythema of extremities. Neurologic:  Face is symmetric. Moving all extremities. No gross  focal neurologic deficits . Skin: Skin is warm, dry.  No rash or ulcers Psychiatric: Mood and affect are appropriate     Past Medical History:  Diagnosis Date   Cancer ([place])    pancreatic mass   Left inguinal hernia    Sleep apnea    Stroke [facility])    reportedly had intracranial bleed    Past Surgical History:  Procedure Laterality Date   BRAIN SURGERY        reports that he has never smoked. He has never used smokeless tobacco. He reports that he does not currently use alcohol. He reports that he does not currently use drugs.  No Known Allergies  History reviewed. No pertinent family history.    Prior to Admission medications   Not on File      Labs on Admission: I have personally reviewed following labs and imaging studies  CBC: Recent Labs  Lab 11/02/2021 2304  WBC 14.5*  HGB 16.5  HCT 48.6  MCV 85.0  PLT 846   Basic Metabolic Panel: Recent Labs  Lab 11/11/2021 2304  NA 139  K 3.5  CL 95*  CO2 29  GLUCOSE 137*  BUN 16  CREATININE 1.24  CALCIUM 9.5  MG 2.5*   GFR: Estimated Creatinine Clearance: 47.2 mL/min (by C-G formula based on SCr of 1.24 mg/dL). Liver Function Tests: Recent Labs  Lab 11/12/2021 2304  AST 17  ALT 17  ALKPHOS 95  BILITOT 1.6*  PROT 7.8  ALBUMIN 3.9   Recent Labs  Lab 11/01/2021 2304  LIPASE 87*   No results for input(s): AMMONIA in the last 168 hours. Coagulation Profile: No results for input(s): INR, PROTIME in the last 168 hours. Cardiac Enzymes: No results for input(s): CKTOTAL, CKMB, CKMBINDEX, TROPONINI in the last 168 hours. BNP (last 3 results) No results for input(s): PROBNP in the last 8760 hours. HbA1C: No results for input(s): HGBA1C in the last 72 hours. CBG: No results for input(s): GLUCAP in the last 168 hours. Lipid Profile: No results for input(s): CHOL, HDL, LDLCALC, TRIG, CHOLHDL, LDLDIRECT in the last 72 hours. Thyroid Function Tests: No results for input(s): TSH, T4TOTAL, FREET4, T3FREE, THYROIDAB in the last 72 hours. Anemia Panel: No results for input(s): VITAMINB12, FOLATE, FERRITIN, TIBC, IRON, RETICCTPCT in the last 72 hours. Urine analysis: No results found for: COLORURINE, APPEARANCEUR, [place], [place], [place], [place], BILIRUBINUR, KETONESUR, PROTEINUR, UROBILINOGEN, NITRITE, LEUKOCYTESUR  Radiological Exams on Admission: DG Abdomen Acute  W/Chest  Result Date: 11/22/2021 CLINICAL DATA:  Vomiting EXAM: DG ABDOMEN ACUTE WITH 1 VIEW CHEST COMPARISON:  None. FINDINGS: There is no evidence of dilated bowel loops or free intraperitoneal air. No radiopaque calculi or other significant radiographic abnormality is seen. Heart size and mediastinal contours are within normal limits. Both lungs are clear. IMPRESSION: Negative abdominal radiographs.  No acute cardiopulmonary disease. Electronically Signed   By: Ulyses Jarred M.D.   On: 11/22/2021 00:46       Athena Masse MD [facility]   11/22/2021, 2:44 AM

## 2021-11-23 ENCOUNTER — Telehealth: Payer: Self-pay

## 2021-11-23 DIAGNOSIS — R54 Age-related physical debility: Secondary | ICD-10-CM | POA: Diagnosis present

## 2021-11-23 DIAGNOSIS — Z515 Encounter for palliative care: Secondary | ICD-10-CM

## 2021-11-23 DIAGNOSIS — Z8673 Personal history of transient ischemic attack (TIA), and cerebral infarction without residual deficits: Secondary | ICD-10-CM | POA: Diagnosis not present

## 2021-11-23 DIAGNOSIS — Z66 Do not resuscitate: Secondary | ICD-10-CM | POA: Diagnosis present

## 2021-11-23 DIAGNOSIS — K8689 Other specified diseases of pancreas: Secondary | ICD-10-CM

## 2021-11-23 DIAGNOSIS — I10 Essential (primary) hypertension: Secondary | ICD-10-CM | POA: Diagnosis present

## 2021-11-23 DIAGNOSIS — Z789 Other specified health status: Secondary | ICD-10-CM

## 2021-11-23 DIAGNOSIS — R64 Cachexia: Secondary | ICD-10-CM | POA: Diagnosis not present

## 2021-11-23 DIAGNOSIS — C259 Malignant neoplasm of pancreas, unspecified: Secondary | ICD-10-CM | POA: Diagnosis present

## 2021-11-23 DIAGNOSIS — Z20822 Contact with and (suspected) exposure to covid-19: Secondary | ICD-10-CM | POA: Diagnosis present

## 2021-11-23 DIAGNOSIS — D72828 Other elevated white blood cell count: Secondary | ICD-10-CM | POA: Diagnosis present

## 2021-11-23 DIAGNOSIS — E43 Unspecified severe protein-calorie malnutrition: Secondary | ICD-10-CM | POA: Insufficient documentation

## 2021-11-23 DIAGNOSIS — Z452 Encounter for adjustment and management of vascular access device: Secondary | ICD-10-CM | POA: Diagnosis not present

## 2021-11-23 DIAGNOSIS — R918 Other nonspecific abnormal finding of lung field: Secondary | ICD-10-CM | POA: Diagnosis not present

## 2021-11-23 DIAGNOSIS — R112 Nausea with vomiting, unspecified: Secondary | ICD-10-CM | POA: Diagnosis not present

## 2021-11-23 DIAGNOSIS — C251 Malignant neoplasm of body of pancreas: Secondary | ICD-10-CM | POA: Diagnosis not present

## 2021-11-23 DIAGNOSIS — Z8042 Family history of malignant neoplasm of prostate: Secondary | ICD-10-CM | POA: Diagnosis not present

## 2021-11-23 DIAGNOSIS — G4730 Sleep apnea, unspecified: Secondary | ICD-10-CM | POA: Diagnosis present

## 2021-11-23 DIAGNOSIS — E785 Hyperlipidemia, unspecified: Secondary | ICD-10-CM | POA: Diagnosis present

## 2021-11-23 DIAGNOSIS — C7889 Secondary malignant neoplasm of other digestive organs: Secondary | ICD-10-CM | POA: Diagnosis present

## 2021-11-23 DIAGNOSIS — E876 Hypokalemia: Secondary | ICD-10-CM | POA: Diagnosis not present

## 2021-11-23 DIAGNOSIS — G9340 Encephalopathy, unspecified: Secondary | ICD-10-CM | POA: Diagnosis present

## 2021-11-23 DIAGNOSIS — K311 Adult hypertrophic pyloric stenosis: Secondary | ICD-10-CM | POA: Diagnosis present

## 2021-11-23 DIAGNOSIS — G893 Neoplasm related pain (acute) (chronic): Secondary | ICD-10-CM | POA: Diagnosis present

## 2021-11-23 DIAGNOSIS — Z681 Body mass index (BMI) 19 or less, adult: Secondary | ICD-10-CM | POA: Diagnosis not present

## 2021-11-23 DIAGNOSIS — R1110 Vomiting, unspecified: Secondary | ICD-10-CM | POA: Diagnosis present

## 2021-11-23 DIAGNOSIS — K920 Hematemesis: Secondary | ICD-10-CM

## 2021-11-23 DIAGNOSIS — K226 Gastro-esophageal laceration-hemorrhage syndrome: Secondary | ICD-10-CM | POA: Diagnosis present

## 2021-11-23 DIAGNOSIS — C784 Secondary malignant neoplasm of small intestine: Secondary | ICD-10-CM | POA: Diagnosis present

## 2021-11-23 DIAGNOSIS — Z800 Family history of malignant neoplasm of digestive organs: Secondary | ICD-10-CM | POA: Diagnosis not present

## 2021-11-23 DIAGNOSIS — K869 Disease of pancreas, unspecified: Secondary | ICD-10-CM | POA: Diagnosis not present

## 2021-11-23 DIAGNOSIS — C258 Malignant neoplasm of overlapping sites of pancreas: Secondary | ICD-10-CM | POA: Diagnosis not present

## 2021-11-23 DIAGNOSIS — E870 Hyperosmolality and hypernatremia: Secondary | ICD-10-CM | POA: Diagnosis not present

## 2021-11-23 LAB — CBC
HCT: 45.7 % (ref 39.0–52.0)
Hemoglobin: 15.4 g/dL (ref 13.0–17.0)
MCH: 28.7 pg (ref 26.0–34.0)
MCHC: 33.7 g/dL (ref 30.0–36.0)
MCV: 85.1 fL (ref 80.0–100.0)
Platelets: 154 10*3/uL (ref 150–400)
RBC: 5.37 MIL/uL (ref 4.22–5.81)
RDW: 13.2 % (ref 11.5–15.5)
WBC: 20.6 10*3/uL — ABNORMAL HIGH (ref 4.0–10.5)
nRBC: 0 % (ref 0.0–0.2)

## 2021-11-23 LAB — BASIC METABOLIC PANEL
Anion gap: 13 (ref 5–15)
BUN: 17 mg/dL (ref 8–23)
CO2: 26 mmol/L (ref 22–32)
Calcium: 8.7 mg/dL — ABNORMAL LOW (ref 8.9–10.3)
Chloride: 102 mmol/L (ref 98–111)
Creatinine, Ser: 0.94 mg/dL (ref 0.61–1.24)
GFR, Estimated: >60 mL/min (ref >60)
Glucose, Bld: 104 mg/dL — ABNORMAL HIGH (ref 70–99)
Potassium: 4 mmol/L (ref 3.5–5.1)
Sodium: 141 mmol/L (ref 135–145)

## 2021-11-23 LAB — PROTIME-INR
INR: 1 (ref 0.8–1.2)
Prothrombin Time: 12.8 seconds (ref 11.4–15.2)

## 2021-11-23 MED ORDER — ADULT MULTIVITAMIN W/MINERALS CH
1.0000 | ORAL_TABLET | Freq: Every day | ORAL | Status: DC
  Administered 2021-11-25: 1 via ORAL
  Filled 2021-11-23: qty 1

## 2021-11-23 MED ORDER — SODIUM CHLORIDE 0.9 % IV SOLN: 12.5000 mg | Freq: Four times a day (QID) | INTRAVENOUS | Status: DC | PRN

## 2021-11-23 MED ORDER — LORAZEPAM 2 MG/ML IJ SOLN
0.5000 mg | Freq: Three times a day (TID) | INTRAMUSCULAR | Status: DC | PRN
  Administered 2021-11-24 – 2021-11-26 (×2): 0.5 mg via INTRAVENOUS
  Filled 2021-11-23 (×2): qty 1

## 2021-11-23 MED ORDER — SODIUM CHLORIDE 0.9 % IV SOLN: 25.0000 mg | Freq: Four times a day (QID) | INTRAVENOUS | Status: DC | PRN

## 2021-11-23 MED ORDER — BOOST / RESOURCE BREEZE PO LIQD CUSTOM: 1.0000 | Freq: Three times a day (TID) | ORAL | Status: DC

## 2021-11-23 MED ORDER — SODIUM CHLORIDE 0.9 % IV SOLN
12.5000 mg | Freq: Four times a day (QID) | INTRAVENOUS | Status: DC | PRN
  Administered 2021-11-23 – 2021-11-25 (×3): 12.5 mg via INTRAVENOUS
  Filled 2021-11-23: qty 0.5
  Filled 2021-11-23 (×3): qty 12.5
  Filled 2021-11-23: qty 0.5

## 2021-11-23 MED ORDER — SODIUM CHLORIDE 0.9 % IV SOLN
25.0000 mg | Freq: Four times a day (QID) | INTRAVENOUS | Status: DC | PRN
  Filled 2021-11-23: qty 1

## 2021-11-23 NOTE — Progress Notes (Addendum)
Initial Nutrition Assessment  DOCUMENTATION CODES:   Severe malnutrition in context of chronic illness  INTERVENTION:   Boost Breeze po TID, each supplement provides 250 kcal and 9 grams of protein  MVI po daily   Pt at high refeed risk; recommend monitor potassium, magnesium and phosphorus labs daily until stable  If nasogastric tube placed, recommend:   Osmolite 1.5@60ml /hr- Initiate at 88m/hr and increase by 139mhr q 12 hours until goal rate is reached.   Pro-Source 4541mID via tube, provides 40kcal and 11g of protein per serving   Free water flushes 40m40m hours to maintain tube patency   Regimen provides 2240kcal/day, 112g/day protein and 1277ml32m of free water   NUTRITION DIAGNOSIS:   Severe Malnutrition related to cancer and cancer related treatments as evidenced by severe fat depletion, severe muscle depletion.  GOAL:   Patient will meet greater than or equal to 90% of their needs  MONITOR:   PO intake, Supplement acceptance, Diet advancement, Labs, Weight trends, Skin, I & O's  REASON FOR ASSESSMENT:   Malnutrition Screening Tool    ASSESSMENT:   70 y.26 male with medical history significant for OSA, ICH, inguinal hernia and recently diagnosed pancreatic mass (likely advanced pancreatic cancer, seen by oncology on 12/23 with plans for biopsy to establish tissue diagnosis) who presents to the ED for evaluation of vomiting.  Met with pt and pt's wife in room today. History obtained from both pt and wife. Wife reports pt with poor appetite and oral intake for ~6 months. Wife reports that pt has lost > 40lbs over the past 6 months and that his UBW is ~175lbs. Pt has been having nausea and vomiting for several weeks and is unable to keep down any food. Wife reports that she did buy Ensure and Boost supplements for pt at home but that he did not really drink them. Pt reports eating 100% of his broth and [place] lunch today but then reports that he threw  it all up; pt reports that he has vomited around 4 times today. Pt does feel as though his nausea is improved after receiving antiemetics. RD will add supplements and MVI to help pt meet his estimated needs. Pt is at high refeed risk.   Pt and wife report that their goal at this time is to meet with oncology and to ask questions about prognosis, etc. If pt decides to proceed with full work-up, biopsies and treatment discussions, recommend placement of a post pyloric feeding tube to provide nutrition support while pt is being worked up and deciding about treatment. RD spoke with pt and wife about feeding tube and pt reports that he is willing to have the temporary feeding tube if he needs it; he reports that "I will do what I need to do". If prognosis is poor or if pt does not wish to pursue any further treatment, then feeding tube is not recommended. Nutrition was discussed with MD. RD will monitor for GOC [place] as needed.   Medications reviewed and include: protonix, LRS @75ml /hr  Labs reviewed: K 4.0 wnl Wbc- 20.6(H)  NUTRITION - FOCUSED PHYSICAL EXAM:  Flowsheet Row Most Recent Value  Orbital Region Severe depletion  Upper Arm Region Severe depletion  Thoracic and Lumbar Region Severe depletion  Buccal Region Severe depletion  Temple Region Severe depletion  Clavicle Bone Region Severe depletion  Clavicle and Acromion Bone Region Severe depletion  Scapular Bone Region Severe depletion  Dorsal Hand Severe depletion  Patellar Region  Severe depletion  Anterior Thigh Region Severe depletion  Posterior Calf Region Severe depletion  Edema (RD Assessment) None  Hair Reviewed  Eyes Reviewed  Mouth Reviewed  Skin Reviewed  Nails Reviewed   Diet Order:   Diet Order             Diet clear liquid Room service appropriate? Yes; Fluid consistency: Thin  Diet effective now                  EDUCATION NEEDS:   Education needs have been addressed  Skin:  Skin Assessment:  Reviewed RN Assessment  Last BM:  12/23- No BM in 4 days; MD aware   Height:   Ht Readings from Last 1 Encounters:  11/10/2021 6' 2"  (1.88 m)    Weight:   Wt Readings from Last 1 Encounters:  11/05/2021 62 kg    Ideal Body Weight:  86.3 kg  BMI:  Body mass index is 17.55 kg/m.  Estimated Nutritional Needs:   Kcal:  2000-2300kcal/day  Protein:  100-115g/day  Fluid:  1.7-1.9L/day  Koleen Distance MS, RD, LDN Please refer to [facility] for RD and/or RD on-call/weekend/after hours pager

## 2021-11-23 NOTE — Telephone Encounter (Signed)
Called and spoke with spouse, Hoyle Sauer. Will arrange EUS at [facility] or [facility], whichever is first available. I will call her later today or in the am regarding place of biopsy. All questions answered.

## 2021-11-23 NOTE — Consult Note (Addendum)
Chief Complaint: Patient was seen in consultation today for image guided pancreatic mass biopsy Chief Complaint  Patient presents with   Emesis    Referring Physician(s): Yu,Z  Supervising Physician: Juliet Rude  Patient Status: [facility] - In-pt  History of Present Illness: Jeffrey Carr is a 70 y.o. male with past medical history of stroke, sleep apnea who presents now with recent imaging suggesting locally advanced pancreatic cancer.  He was seen by oncology on 12/23 with plans for biopsy to establish tissue diagnosis but presented to [facility] ED on 12/25 with vomiting and hematemesis.  GI feels hematemesis likely due to Mallory-Weiss tear from persistent vomiting.  Patient was tentatively scheduled for EUS biopsy on January 5 ,however oncology would like to establish diagnosis sooner if possible. Request now received for image guided pancreatic mass biopsy.  Latest labs include WBC 20.6, hemoglobin normal, platelets normal, PT/INR normal, creatinine normal, total bilirubin 1.6.  Respiratory panel negative.  Past Medical History:  Diagnosis Date   Cancer [facility])    pancreatic mass   Left inguinal hernia    Sleep apnea    Stroke [facility])    reportedly had intracranial bleed    Past Surgical History:  Procedure Laterality Date   BRAIN SURGERY      Allergies: Patient has no known allergies.  Medications: Prior to Admission medications   Medication Sig Start Date End Date Taking? Authorizing Provider  ondansetron (ZOFRAN) 4 MG tablet Take 4 mg by mouth every 8 (eight) hours as needed for nausea.   Yes Geanie Kenning, PA-C  omeprazole (PRILOSEC) 20 MG capsule Take 20 mg by mouth 2 (two) times daily. For 30 days Patient not taking: Reported on 11/22/2021    Juluis Pitch, MD     History reviewed. No pertinent family history.  Social History   Socioeconomic History   Marital status: Married    Spouse name: Not on file   Number of children: Not on file   Years  of education: Not on file   Highest education level: Not on file  Occupational History   Not on file  Tobacco Use   Smoking status: Never   Smokeless tobacco: Never  Substance and Sexual Activity   Alcohol use: Not Currently   Drug use: Not Currently   Sexual activity: Not on file  Other Topics Concern   Not on file  Social History Narrative   Not on file   Social Determinants of Health   Financial Resource Strain: Not on file  Food Insecurity: Not on file  Transportation Needs: Not on file  Physical Activity: Not on file  Stress: Not on file  Social Connections: Not on file      Review of Systems currently denies fever, headache, chest pain, worsening dyspnea, abdominal/back pain, or any further bleeding; he does have occasional cough, intermittent nausea /vomiting, fatigue, diminished appetite and weight loss  Vital Signs: BP 139/83 (BP Location: Right Arm)    Pulse 91    Temp 98.6 F (37 C)    Resp 18    Ht 6\' 2"  (1.88 m)    Wt 136 lb 11 oz (62 kg)    SpO2 95%    BMI 17.55 kg/m   Physical Exam frail appearing white male; chest clear to auscultation bilaterally.  Heart with regular rate and rhythm.  Abdomen soft, positive bowel sounds, currently nontender.  No lower extremity edema.  Imaging: DG Abdomen Acute W/Chest  Result Date: 11/22/2021 CLINICAL DATA:  Vomiting EXAM:  DG ABDOMEN ACUTE WITH 1 VIEW CHEST COMPARISON:  None. FINDINGS: There is no evidence of dilated bowel loops or free intraperitoneal air. No radiopaque calculi or other significant radiographic abnormality is seen. Heart size and mediastinal contours are within normal limits. Both lungs are clear. IMPRESSION: Negative abdominal radiographs.  No acute cardiopulmonary disease. Electronically Signed   By: Ulyses Jarred M.D.   On: 11/22/2021 00:46    Labs:  CBC: Recent Labs    11/19/2021 2304 11/22/21 0623 11/23/21 0852  WBC 14.5* 14.9* 20.6*  HGB 16.5 15.2 15.4  HCT 48.6 44.4 45.7  PLT 213 199 154     COAGS: Recent Labs    11/23/21 1211  INR 1.0    BMP: Recent Labs    11/24/2021 2304 11/22/21 0623 11/23/21 0852  NA 139 142 141  K 3.5 3.7 4.0  CL 95* 100 102  CO2 29 26 26   GLUCOSE 137* 131* 104*  BUN 16 18 17   CALCIUM 9.5 8.9 8.7*  CREATININE 1.24 1.11 0.94  GFRNONAA >60 >60 >60    LIVER FUNCTION TESTS: Recent Labs    11/09/2021 2304  BILITOT 1.6*  AST 17  ALT 17  ALKPHOS 95  PROT 7.8  ALBUMIN 3.9    TUMOR MARKERS: No results for input(s): AFPTM, CEA, CA199, CHROMGRNA in the last 8760 hours.  Assessment and Plan: 70 y.o. male with past medical history of stroke, sleep apnea who presents now with recent imaging suggesting locally advanced pancreatic cancer.  He was seen by oncology on 12/23 with plans for biopsy to establish tissue diagnosis but presented to [facility] ED on 12/25 with vomiting and hematemesis.  GI feels hematemesis likely due to Mallory-Weiss tear from persistent vomiting.  Patient was tentatively scheduled for EUS biopsy on January 5 ,however oncology would like to establish diagnosis sooner if possible. Request now received for image guided pancreatic mass biopsy.  Latest labs include WBC 20.6, hemoglobin normal, platelets normal, PT/INR normal, creatinine normal, total bilirubin 1.6.  Respiratory panel negative.  Imaging studies have been reviewed by Dr. Kathlene Cote.Risks and benefits of procedure was discussed with the patient/spouse including, but not limited to bleeding, infection, damage to adjacent structures or low yield requiring additional tests or inability to safely access mass for biopsy.  All of the questions were answered and there is agreement to proceed.  Consent signed and in chart.  NOTE: PT ALSO LISTED UNDER Jeffrey Carr WHICH IS HIS REAL NAME PER SPOUSE(INFO HAS BEEN MARKED FOR MERGER)  Thank you for this interesting consult.  I greatly enjoyed meeting Jeffrey Carr and look forward to participating in their care.  A copy of this  report was sent to the requesting provider on this date.  Electronically Signed: D. Rowe Robert, PA-C 11/23/2021, 3:35 PM   I spent a total of  25 minutes   in face to face in clinical consultation, greater than 50% of which was counseling/coordinating care for image guided pancreatic mass biopsy

## 2021-11-23 NOTE — Progress Notes (Signed)
PROGRESS NOTE    Jeffrey Carr  PIR:[id number] DOB: 1950/12/20 DOA: 11/23/2021 PCP: Juluis Pitch, MD    Chief Complaint  Patient presents with   Emesis    Brief Narrative:  Jeffrey Carr is a 70 y.o. male with medical history significant for Recently diagnosed pancreatic mass, likely locally advanced pancreatic cancer, seen by oncology on 12/23 with plans for biopsy to establish tissue diagnosis, who presents to the ED for evaluation of vomiting and hematemesis.  GI consulted for hematemesis, no further work-up recommended by gastroenterology.   Oncology consulted and plan for IR guided biopsy to establish tissue diagnosis.  Meanwhile symptomatic management with IV fluids and IV anti emetics and IV Protonix. Palliative care consulted for goals of care discussion.  At this point patient and family would like to talk to oncology about prognosis, with and without treatment  Assessment & Plan:   Principal Problem:   Intractable vomiting Active Problems:   Pancreatic mass   Pancreatic cancer (HCC)    Persistent nausea vomiting abdominal pain probably secondary to pancreatic mass/ cancer Symptomatic management with IV fluids, IV antiemetics and IV Protonix. Patient to be scheduled for IR guided biopsy for tissue diagnosis Oncology and palliative on board    Hematemesis on admission probably secondary to Mallory-Weiss tear from persistent vomiting.  GI on board and appreciate recommendations.   Leukocytosis Suspect reactive. Respiratory panel for influenza and COVID's negative. Chest x-ray is negative for any kind of infection    DVT prophylaxis: SCDs Code Status: Full code Family Communication: Family at bedside Disposition:   Status is: Inpatient  Remains inpatient appropriate because: Metastatic pancreatic cancer work-up       Consultants:  Oncologist Dr. Tasia Catchings Gastroenterology Palliative care  Procedures: None  antimicrobials: None  Subjective: Patient  reports being nauseated earlier this morning and had coffee-ground emesis  Objective: Vitals:   11/23/21 0524 11/23/21 0732 11/23/21 0750 11/23/21 1148  BP: (!) 156/76 (!) 149/80 127/76 139/83  Pulse: 87 84 80 91  Resp: 20 18 18 18   Temp: 98.7 F (37.1 C) 98.9 F (37.2 C) 98.8 F (37.1 C) 98.6 F (37 C)  TempSrc: Oral Oral    SpO2: 97% 97% 97% 95%  Weight:      Height:        Intake/Output Summary (Last 24 hours) at 11/23/2021 1437 Last data filed at 11/23/2021 0900 Gross per 24 hour  Intake 2617.78 ml  Output 500 ml  Net 2117.78 ml   Filed Weights   11/01/2021 2259  Weight: 62 kg    Examination:  General exam: Cachectic looking gentleman appears to be deconditioned not in any kind of distress Respiratory system: Clear to auscultation. Respiratory effort normal. Cardiovascular system: S1 & S2 heard, RRR. No JVD,  No pedal edema. Gastrointestinal system: Abdomen is soft mild generalized tenderness,  normal bowel sounds heard. Central nervous system: Alert and oriented to person and place only Extremities: Symmetric 5 x 5 power. Skin: No rashes, lesions or ulcers Psychiatry: Flat affect    Data Reviewed: I have personally reviewed following labs and imaging studies  CBC: Recent Labs  Lab 11/15/2021 2304 11/22/21 0623 11/23/21 0852  WBC 14.5* 14.9* 20.6*  HGB 16.5 15.2 15.4  HCT 48.6 44.4 45.7  MCV 85.0 84.4 85.1  PLT 213 199 630    Basic Metabolic Panel: Recent Labs  Lab 11/23/2021 2304 11/22/21 0623 11/23/21 0852  NA 139 142 141  K 3.5 3.7 4.0  CL 95* 100 102  CO2 29  26 26  GLUCOSE 137* 131* 104*  BUN 16 18 17   CREATININE 1.24 1.11 0.94  CALCIUM 9.5 8.9 8.7*  MG 2.5*  --   --     GFR: Estimated Creatinine Clearance: 62.3 mL/min (by C-G formula based on SCr of 0.94 mg/dL).  Liver Function Tests: Recent Labs  Lab 11/20/2021 2304  AST 17  ALT 17  ALKPHOS 95  BILITOT 1.6*  PROT 7.8  ALBUMIN 3.9    CBG: No results for input(s): GLUCAP in  the last 168 hours.   Recent Results (from the past 240 hour(s))  Resp Panel by RT-PCR (Flu A&B, Covid) Nasopharyngeal Swab     Status: None   Collection Time: 11/08/2021 11:04 PM   Specimen: Nasopharyngeal Swab; Nasopharyngeal(NP) swabs in vial transport medium  Result Value Ref Range Status   SARS Coronavirus 2 by RT PCR NEGATIVE NEGATIVE Final    Comment: (NOTE) SARS-CoV-2 target nucleic acids are NOT DETECTED.  The SARS-CoV-2 RNA is generally detectable in upper respiratory specimens during the acute phase of infection. The lowest concentration of SARS-CoV-2 viral copies this assay can detect is 138 copies/mL. A negative result does not preclude SARS-Cov-2 infection and should not be used as the sole basis for treatment or other patient management decisions. A negative result may occur with  improper specimen collection/handling, submission of specimen other than nasopharyngeal swab, presence of viral mutation(s) within the areas targeted by this assay, and inadequate number of viral copies(<138 copies/mL). A negative result must be combined with clinical observations, patient history, and epidemiological information. The expected result is Negative.  Fact Sheet for Patients:  EntrepreneurPulse.com.au  Fact Sheet for Healthcare Providers:  IncredibleEmployment.be  This test is no t yet approved or cleared by the [place] FDA and  has been authorized for detection and/or diagnosis of SARS-CoV-2 by FDA under an Emergency Use Authorization (EUA). This EUA will remain  in effect (meaning this test can be used) for the duration of the COVID-19 declaration under Section 564(b)(1) of the Act, 21 U.S.C.section 360bbb-3(b)(1), unless the authorization is terminated  or revoked sooner.       Influenza A by PCR NEGATIVE NEGATIVE Final   Influenza B by PCR NEGATIVE NEGATIVE Final    Comment: (NOTE) The Xpert Xpress SARS-CoV-2/FLU/RSV plus  assay is intended as an aid in the diagnosis of influenza from Nasopharyngeal swab specimens and should not be used as a sole basis for treatment. Nasal washings and aspirates are unacceptable for Xpert Xpress SARS-CoV-2/FLU/RSV testing.  Fact Sheet for Patients: EntrepreneurPulse.com.au  Fact Sheet for Healthcare Providers: IncredibleEmployment.be  This test is not yet approved or cleared by the [place] FDA and has been authorized for detection and/or diagnosis of SARS-CoV-2 by FDA under an Emergency Use Authorization (EUA). This EUA will remain in effect (meaning this test can be used) for the duration of the COVID-19 declaration under Section 564(b)(1) of the Act, 21 U.S.C. section 360bbb-3(b)(1), unless the authorization is terminated or revoked.  Performed at [facility], [address] [place], [place] [address]          Radiology Studies: DG Abdomen Acute W/Chest  Result Date: 11/22/2021 CLINICAL DATA:  Vomiting EXAM: DG ABDOMEN ACUTE WITH 1 VIEW CHEST COMPARISON:  None. FINDINGS: There is no evidence of dilated bowel loops or free intraperitoneal air. No radiopaque calculi or other significant radiographic abnormality is seen. Heart size and mediastinal contours are within normal limits. Both lungs are clear. IMPRESSION: Negative abdominal radiographs.  No  acute cardiopulmonary disease. Electronically Signed   By: Ulyses Jarred M.D.   On: 11/22/2021 00:46        Scheduled Meds:  pantoprazole (PROTONIX) IV  40 mg Intravenous Q12H   Continuous Infusions:  lactated ringers 75 mL/hr at 11/23/21 1211   promethazine (PHENERGAN) injection (IM or IVPB)       LOS: 0 days        Hosie Poisson, MD [facility]   To contact the attending provider between 7A-7P or the covering provider during after hours 7P-7A, please log into the web site [email or web address] and access using universal [place] password for  that web site. If you do not have the password, please call the hospital operator.  11/23/2021, 2:37 PM

## 2021-11-23 NOTE — Consult Note (Signed)
Hematology/Oncology Consult note [facility] [phone] [phone]  Patient Care Team: Juluis Pitch, MD as PCP - General (Family Medicine)   Name of the patient: Jeffrey Carr  [id number]  01/27/1951   Date of visit: 11/23/21 REASON FOR COSULTATION:  Intractable nausea vomiting, pancreatic mass  History of presenting illness-  70 y.o. male with PMH listed at below who presents to ER for evaluation of nausea vomiting, hematemesis. Patient has establish care with me last week for new findings of pancreatic mass involving duodenum and stomach on CT and MRI.-Patient has had unintentional weight loss.  GI was consulted.  Since admission, patient has not had additional episode of hematic emesis and hemoglobin has remained normal.  Gastroenterologist feels that hematemesis is likely related to Mallory-Weiss tear from intermittent nausea vomiting.  Conservative management was recommended.  GI will do emergent EGD if acute hemodynamic changes or signs of active GI bleeding.  Hematology oncology was consulted for further evaluation management.  Patient was seen at the bedside.  He is not able to provide much history due to weakness, nausea vomiting.  Wife at the bedside.  EUS biopsy availability is not until January    Review of Systems  Unable to perform ROS: Acuity of condition  Constitutional:  Positive for appetite change, fatigue and unexpected weight change.  Respiratory:  Negative for hemoptysis and shortness of breath.   Cardiovascular:  Negative for chest pain.  Gastrointestinal:  Positive for nausea and vomiting.  Neurological:  Negative for light-headedness.  Hematological:  Does not bruise/bleed easily.  Psychiatric/Behavioral:  The patient is nervous/anxious.    No Known Allergies  Patient Active Problem List   Diagnosis Date Noted   Pancreatic cancer ([place]) 11/23/2021   Protein-calorie malnutrition, severe 11/23/2021    Intractable vomiting 11/22/2021   Pancreatic mass 11/22/2021     Past Medical History:  Diagnosis Date   Cancer [facility])    pancreatic mass   Left inguinal hernia    Sleep apnea    Stroke [facility])    reportedly had intracranial bleed     Past Surgical History:  Procedure Laterality Date   BRAIN SURGERY      Social History   Socioeconomic History   Marital status: Married    Spouse name: Not on file   Number of children: Not on file   Years of education: Not on file   Highest education level: Not on file  Occupational History   Not on file  Tobacco Use   Smoking status: Never   Smokeless tobacco: Never  Substance and Sexual Activity   Alcohol use: Not Currently   Drug use: Not Currently   Sexual activity: Not on file  Other Topics Concern   Not on file  Social History Narrative   Not on file   Social Determinants of Health   Financial Resource Strain: Not on file  Food Insecurity: Not on file  Transportation Needs: Not on file  Physical Activity: Not on file  Stress: Not on file  Social Connections: Not on file  Intimate Partner Violence: Not on file     History reviewed. No pertinent family history.   Current Facility-Administered Medications:    acetaminophen (TYLENOL) tablet 650 mg, 650 mg, Oral, Q6H PRN **OR** acetaminophen (TYLENOL) suppository 650 mg, 650 mg, Rectal, Q6H PRN, Athena Masse, MD   [START ON 11/24/2021] feeding supplement (BOOST / RESOURCE BREEZE) liquid 1 Container, 1 Container, Oral, TID BM, Hosie Poisson, MD   hydrALAZINE (APRESOLINE)  injection 5 mg, 5 mg, Intravenous, Q8H PRN, Hosie Poisson, MD   lactated ringers infusion, , Intravenous, Continuous, Hosie Poisson, MD, Last Rate: 75 mL/hr at 11/23/21 1211, New Bag at 11/23/21 1211   LORazepam (ATIVAN) injection 0.5 mg, 0.5 mg, Intravenous, Q8H PRN, Earlie Server, MD   morphine 2 MG/ML injection 2 mg, 2 mg, Intravenous, Q2H PRN, Athena Masse, MD   [START ON 11/24/2021] multivitamin with  minerals tablet 1 tablet, 1 tablet, Oral, Daily, Hosie Poisson, MD   ondansetron (ZOFRAN) tablet 4 mg, 4 mg, Oral, Q6H PRN **OR** ondansetron (ZOFRAN) injection 4 mg, 4 mg, Intravenous, Q6H PRN, Athena Masse, MD, 4 mg at 11/23/21 0941   pantoprazole (PROTONIX) injection 40 mg, 40 mg, Intravenous, Q12H, Tahiliani, Varnita B, MD, 40 mg at 11/23/21 0941   promethazine (PHENERGAN) 25 mg in sodium chloride 0.9 % 50 mL IVPB, 25 mg, Intravenous, Q6H PRN, Earlie Server, MD   Physical exam:  Vitals:   11/23/21 0732 11/23/21 0750 11/23/21 1148 11/23/21 1545  BP: (!) 149/80 127/76 139/83 135/78  Pulse: 84 80 91 84  Resp: 18 18 18 18   Temp: 98.9 F (37.2 C) 98.8 F (37.1 C) 98.6 F (37 C) 98.4 F (36.9 C)  TempSrc: Oral     SpO2: 97% 97% 95% 95%  Weight:      Height:       Physical Exam Constitutional:      General: He is not in acute distress.    Comments: Cachectic  HENT:     Head: Normocephalic and atraumatic.     Nose: Nose normal.     Mouth/Throat:     Pharynx: No oropharyngeal exudate.  Eyes:     General: No scleral icterus.    Pupils: Pupils are equal, round, and reactive to light.  Cardiovascular:     Rate and Rhythm: Normal rate and regular rhythm.     Heart sounds: No murmur heard. Pulmonary:     Effort: Pulmonary effort is normal. No respiratory distress.     Breath sounds: No rales.  Chest:     Chest wall: No tenderness.  Abdominal:     General: There is no distension.     Palpations: Abdomen is soft.     Tenderness: There is no abdominal tenderness.  Musculoskeletal:        General: Normal range of motion.     Cervical back: Normal range of motion and neck supple.  Skin:    General: Skin is warm and dry.     Findings: No erythema.  Neurological:     Mental Status: He is alert and oriented to person, place, and time.     Cranial Nerves: No cranial nerve deficit.     Motor: No abnormal muscle tone.     Coordination: Coordination normal.  Psychiatric:        Mood  and Affect: Affect normal.        CMP Latest Ref Rng & Units 11/23/2021  Glucose 70 - 99 mg/dL 104(H)  BUN 8 - 23 mg/dL 17  Creatinine 0.61 - 1.24 mg/dL 0.94  Sodium 135 - 145 mmol/L 141  Potassium 3.5 - 5.1 mmol/L 4.0  Chloride 98 - 111 mmol/L 102  CO2 22 - 32 mmol/L 26  Calcium 8.9 - 10.3 mg/dL 8.7(L)  Total Protein 6.5 - 8.1 g/dL -  Total Bilirubin 0.3 - 1.2 mg/dL -  Alkaline Phos 38 - 126 U/L -  AST 15 - 41 U/L -  ALT  0 - 44 U/L -   CBC Latest Ref Rng & Units 11/23/2021  WBC 4.0 - 10.5 K/uL 20.6(H)  Hemoglobin 13.0 - 17.0 g/dL 15.4  Hematocrit 39.0 - 52.0 % 45.7  Platelets 150 - 400 K/uL 154    RADIOGRAPHIC STUDIES: I have personally reviewed the radiological images as listed and agreed with the findings in the report. DG Abdomen Acute W/Chest  Result Date: 11/22/2021 CLINICAL DATA:  Vomiting EXAM: DG ABDOMEN ACUTE WITH 1 VIEW CHEST COMPARISON:  None. FINDINGS: There is no evidence of dilated bowel loops or free intraperitoneal air. No radiopaque calculi or other significant radiographic abnormality is seen. Heart size and mediastinal contours are within normal limits. Both lungs are clear. IMPRESSION: Negative abdominal radiographs.  No acute cardiopulmonary disease. Electronically Signed   By: Ulyses Jarred M.D.   On: 11/22/2021 00:46    Assessment and plan-   #Acute hematemesis Hemoglobin remained stable.  Was felt to be secondary to Mallory-Weiss tear due to nausea vomiting Gastroenterology Recommendation reviewed.  Conservative management for now unless patient develops signs of active GI bleeding or hemodynamic instability IV PPI  #Intractable nausea vomiting Due to stomach involvement from pancreatic mass. Xray showed no signs of obstruction.  IV Zofran I added IV phnergan and low-dose Ativan Supportive care  #Pancreatic mass with involvement of stomach and duodenum,  I discussed with interventional radiology Dr. Kathlene Cote.there is probably a window for  biopsy on the CT, but that could change.  We will try IR CT-guided biopsy of the pancreatic mass given that EUS biopsy availability is not until January.  Patient and wife agree with the Plan.       Thank you for allowing me to participate in the care of this patient.    Earlie Server, MD, PhD  11/23/2021

## 2021-11-23 NOTE — Consult Note (Addendum)
Consultation Note Date: 11/23/2021   Patient Name: Jeffrey Carr  DOB: [birth year]  MRN: [id number]  Age / Sex: 70 y.o., male  PCP: Juluis Pitch, MD Referring Physician: Hosie Poisson, MD  Reason for Consultation: Establishing goals of care  HPI/Patient Profile: 70 y.o. male  with past medical history of left inguinal hernia with repair, sleep apnea, and intracranial bleed admitted on 11/20/2021 with intractable nausea and vomiting.  Patient has had nausea with vomiting and hematemesis at home for about a week prior to admission into the hospital.  Patient had outpatient CT which revealed a pancreatic mass.  Patient was awaiting EUS for tissue diagnosis before admission to the hospital.  Palliative medicine was consulted to discuss goals of care.  Clinical Assessment and Goals of Care: I have reviewed medical records including EPIC notes, labs and imaging, assessed the patient and then met with patient and his wife at bedside to discuss diagnosis prognosis, GOC, EOL wishes, disposition and options.  I introduced Palliative Medicine as specialized medical care for people living with serious illness. It focuses on providing relief from the symptoms and stress of a serious illness. The goal is to improve quality of life for both the patient and the family.  Patient's wife and I complete almost entire discussion with little input from patient.  He will nod and say yes or no when prompted.  Wife endorses patient is quiet and stoic man at baseline.  She says this recent finding of a mass has made him almost completely silent during any and all discussions regarding his health.  We discussed a brief life review of the patient.  He and his wife have been married since 2004.  He is very close to his 2 stepsons to whom he refers to as his own sons.  As far as functional and nutritional status patient has had increased  nausea and vomiting over the last several months.  Wife endorses patient has lost over 40 pounds in the last few months.   We discussed patient's current illness and what it means in the larger context of patient's on-going co-morbidities.    I attempted to elicit values and goals of care important to the patient.  Wife endorses that if he wants to "fight the fight" then she supports that.  She also shares that if he does not want to undergo any treatment because it would give him a poor quality of life then she would support that as well.  She is imploring him to share his thoughts but he remains silent during our discussion.  The difference between aggressive medical intervention and comfort care was considered in light of the patient's goals of care.  Pros and cons of aggressive medical treatment as well as pros and cons of comfort care provided in detail.  Patient's wife would like to speak with oncology.  She would like to know how much time the patient has with or without treatment.  I shared I would convey her concerns the primary team and oncology regarding prognostication.  Family is facing treatment option decisions, advanced directive, and anticipatory care needs.  Today was about reestablishing a report.  I will continue to monitor the patient throughout his hospitalization and round on him again tomorrow.  No change in CODE STATUS.  Goals were not made clear today as patient did not give any insight or comments regarding his wishes.   Discussed with patient/family the importance of continued conversation with family and the medical providers regarding overall plan of care and treatment options, ensuring decisions are within the context of the patients values and GOCs.     Discussed importance of being medically aggressive with nausea and vomiting control.  Also reiterated that should the patient have pain he needs to ask for pain medication as it is not scheduled but rather as  needed.  Questions and concerns were addressed. The family was encouraged to call with questions or concerns.   Primary Decision Maker PATIENT  Code Status/Advance Care Planning: Full code  Prognosis:   Unable to determine  Discharge Planning: To Be Determined  Primary Diagnoses: Present on Admission:  Intractable vomiting  Pancreatic cancer [facility])   Physical Exam Vitals and nursing note reviewed.  Constitutional:      Comments: Thin, frail  HENT:     Head: Normocephalic and atraumatic.     Mouth/Throat:     Mouth: Mucous membranes are moist.  Cardiovascular:     Rate and Rhythm: Normal rate.     Pulses: Normal pulses.  Pulmonary:     Effort: Pulmonary effort is normal.  Musculoskeletal:     Cervical back: Normal range of motion.     Comments: Generalized weakness     Skin:    General: Skin is warm and dry.  Neurological:     Mental Status: He is alert. Mental status is at baseline.  Psychiatric:        Thought Content: Thought content normal.        Judgment: Judgment normal.    Vital Signs: BP 139/83 (BP Location: Right Arm)    Pulse 91    Temp 98.6 F (37 C)    Resp 18    Ht _0  (1.88 m)    Wt 62 kg    SpO2 95%    BMI 17.55 kg/m  Pain Scale: 0-10   Pain Score: 0-No pain SpO2: SpO2: 95 % O2 Device:SpO2: 95 % O2 Flow Rate: .   Palliative Assessment/Data: 50%      I discussed this patient's plan of care with patient, patient's wife, Dr. Karleen Hampshire.  Thank you for this consult. Palliative medicine will continue to follow and assist holistically.   Time Total: 70 minutes Greater than 50%  of this time was spent counseling and coordinating care related to the above assessment and plan.  Signed by: Jordan Hawks, DNP, FNP-BC Palliative Medicine    Please contact Palliative Medicine Team phone at [phone] for questions and concerns.  For individual provider: See Shea Evans

## 2021-11-23 NOTE — Progress Notes (Addendum)
Jeffrey Antigua, MD [address], [place], [place], [place], [address] [address], [place], [place], [place], [address] Phone: [phone]  Fax: [phone]   Subjective:  Patient resting in bed with wife at bedside.  No hematemesis.  Does report intermittent nausea and vomiting that is improving.  Appears less lethargic today and more alert  Objective: Exam: Vital signs in last 24 hours: Vitals:   11/23/21 0732 11/23/21 0750 11/23/21 1148 11/23/21 1545  BP: (!) 149/80 127/76 139/83 135/78  Pulse: 84 80 91 84  Resp: 18 18 18 18   Temp: 98.9 F (37.2 C) 98.8 F (37.1 C) 98.6 F (37 C) 98.4 F (36.9 C)  TempSrc: Oral     SpO2: 97% 97% 95% 95%  Weight:      Height:       Weight change:   Intake/Output Summary (Last 24 hours) at 11/23/2021 1630 Last data filed at 11/23/2021 1614 Gross per 24 hour  Intake 2617.78 ml  Output 1500 ml  Net 1117.78 ml    Constitutional: General:   Alert,  Well-developed, well-nourished, pleasant and cooperative in NAD BP 135/78 (BP Location: Right Arm)    Pulse 84    Temp 98.4 F (36.9 C)    Resp 18    Ht 6\' 2"  (1.88 m)    Wt 62 kg    SpO2 95%    BMI 17.55 kg/m   Eyes:  Sclera clear, no icterus.   Conjunctiva pink.   Ears:  No scars, lesions or masses, Normal auditory acuity. Nose:  No deformity, discharge, or lesions. Mouth:  No deformity or lesions, oropharynx pink & moist.  Neck:  Supple; no masses, trachea midline  Respiratory: Normal respiratory effort  Gastrointestinal:  Soft, non-tender and non-distended without masses, hepatosplenomegaly or hernias noted.  No guarding or rebound tenderness.     Cardiac: No clubbing or edema.  No cyanosis. Normal posterior tibial pedal pulses noted.  Lymphatic:  No significant cervical adenopathy.  Psych:  Alert and cooperative. Normal mood and affect.  Musculoskeletal:  Head normocephalic, atraumatic. 5/5 Lower extremity strength bilaterally.  Skin: Warm. Intact without  significant lesions or rashes. No jaundice.  Neurologic:  Face symmetrical, tongue midline, Normal sensation to touch  Psych:  Alert and oriented x3, Alert and cooperative. Normal mood and affect.   Lab Results: Lab Results  Component Value Date   WBC 20.6 (H) 11/23/2021   HGB 15.4 11/23/2021   HCT 45.7 11/23/2021   MCV 85.1 11/23/2021   PLT 154 11/23/2021   Micro Results: Recent Results (from the past 240 hour(s))  Resp Panel by RT-PCR (Flu A&B, Covid) Nasopharyngeal Swab     Status: None   Collection Time: 11/06/2021 11:04 PM   Specimen: Nasopharyngeal Swab; Nasopharyngeal(NP) swabs in vial transport medium  Result Value Ref Range Status   SARS Coronavirus 2 by RT PCR NEGATIVE NEGATIVE Final    Comment: (NOTE) SARS-CoV-2 target nucleic acids are NOT DETECTED.  The SARS-CoV-2 RNA is generally detectable in upper respiratory specimens during the acute phase of infection. The lowest concentration of SARS-CoV-2 viral copies this assay can detect is 138 copies/mL. A negative result does not preclude SARS-Cov-2 infection and should not be used as the sole basis for treatment or other patient management decisions. A negative result may occur with  improper specimen collection/handling, submission of specimen other than nasopharyngeal swab, presence of viral mutation(s) within the areas targeted by this assay, and inadequate number of viral copies(<138 copies/mL). A negative result  must be combined with clinical observations, patient history, and epidemiological information. The expected result is Negative.  Fact Sheet for Patients:  EntrepreneurPulse.com.au  Fact Sheet for Healthcare Providers:  IncredibleEmployment.be  This test is no t yet approved or cleared by the [place] FDA and  has been authorized for detection and/or diagnosis of SARS-CoV-2 by FDA under an Emergency Use Authorization (EUA). This EUA will remain  in effect  (meaning this test can be used) for the duration of the COVID-19 declaration under Section 564(b)(1) of the Act, 21 U.S.C.section 360bbb-3(b)(1), unless the authorization is terminated  or revoked sooner.       Influenza A by PCR NEGATIVE NEGATIVE Final   Influenza B by PCR NEGATIVE NEGATIVE Final    Comment: (NOTE) The Xpert Xpress SARS-CoV-2/FLU/RSV plus assay is intended as an aid in the diagnosis of influenza from Nasopharyngeal swab specimens and should not be used as a sole basis for treatment. Nasal washings and aspirates are unacceptable for Xpert Xpress SARS-CoV-2/FLU/RSV testing.  Fact Sheet for Patients: EntrepreneurPulse.com.au  Fact Sheet for Healthcare Providers: IncredibleEmployment.be  This test is not yet approved or cleared by the [place] FDA and has been authorized for detection and/or diagnosis of SARS-CoV-2 by FDA under an Emergency Use Authorization (EUA). This EUA will remain in effect (meaning this test can be used) for the duration of the COVID-19 declaration under Section 564(b)(1) of the Act, 21 U.S.C. section 360bbb-3(b)(1), unless the authorization is terminated or revoked.  Performed at [facility], [address] [place], [place] [address]    Studies/Results: [place] Abdomen Acute W/Chest  Result Date: 11/22/2021 CLINICAL DATA:  Vomiting EXAM: DG ABDOMEN ACUTE WITH 1 VIEW CHEST COMPARISON:  None. FINDINGS: There is no evidence of dilated bowel loops or free intraperitoneal air. No radiopaque calculi or other significant radiographic abnormality is seen. Heart size and mediastinal contours are within normal limits. Both lungs are clear. IMPRESSION: Negative abdominal radiographs.  No acute cardiopulmonary disease. Electronically Signed   By: Ulyses Jarred M.D.   On: 11/22/2021 00:46   Medications:  Scheduled Meds:  [START ON 11/24/2021] feeding supplement  1 Container Oral TID BM   [START ON  11/24/2021] multivitamin with minerals  1 tablet Oral Daily   pantoprazole (PROTONIX) IV  40 mg Intravenous Q12H   Continuous Infusions:  lactated ringers 75 mL/hr at 11/23/21 1211   promethazine (PHENERGAN) injection (IM or IVPB)     PRN Meds:.acetaminophen **OR** acetaminophen, hydrALAZINE, LORazepam, morphine injection, ondansetron **OR** ondansetron (ZOFRAN) IV, promethazine (PHENERGAN) injection (IM or IVPB)   Assessment: Principal Problem:   Intractable vomiting Active Problems:   Pancreatic mass   Pancreatic cancer (HCC)   Protein-calorie malnutrition, severe   Hematemesis with nausea   Intractable nausea and vomiting    Plan: Patient has not had any evidence of active GI bleeding.  He is not having any hematemesis and hemoglobin has been completely normal  Nausea vomiting is likely due to gastric compression from his pancreatic mass  Case discussed with Dr. Tasia Catchings, in regard to assisting with tissue diagnosis.  The plan is for patient to possibly undergo CT-guided IR biopsy of mass, since EUS is not available until January as per Dr. Tasia Catchings (and that is if a cancellation occurs on the schedule).  However, if unable to obtain tissue diagnosis, we can assist with EGD due to see if there is an obvious mass in the stomach that can be biopsies and help with tissue diagnosis  Will place n.p.o.  past midnight orders, in case EGD needs to be done tomorrow, if CT is not able to obtain tissue   LOS: 0 days   Jeffrey Antigua, MD 11/23/2021, 4:30 PM

## 2021-11-24 ENCOUNTER — Inpatient Hospital Stay: Payer: PPO

## 2021-11-24 ENCOUNTER — Telehealth: Payer: Self-pay

## 2021-11-24 ENCOUNTER — Other Ambulatory Visit: Payer: Self-pay

## 2021-11-24 DIAGNOSIS — Z515 Encounter for palliative care: Secondary | ICD-10-CM | POA: Diagnosis not present

## 2021-11-24 DIAGNOSIS — R1110 Vomiting, unspecified: Secondary | ICD-10-CM | POA: Diagnosis not present

## 2021-11-24 DIAGNOSIS — K920 Hematemesis: Secondary | ICD-10-CM | POA: Diagnosis not present

## 2021-11-24 DIAGNOSIS — R112 Nausea with vomiting, unspecified: Secondary | ICD-10-CM | POA: Diagnosis not present

## 2021-11-24 DIAGNOSIS — Z789 Other specified health status: Secondary | ICD-10-CM | POA: Diagnosis not present

## 2021-11-24 DIAGNOSIS — K8689 Other specified diseases of pancreas: Secondary | ICD-10-CM | POA: Diagnosis not present

## 2021-11-24 MED ORDER — SODIUM CHLORIDE 0.9 % IV SOLN
INTRAVENOUS | Status: DC | PRN
  Administered 2021-11-24: 10 mL/h via INTRAVENOUS

## 2021-11-24 MED ORDER — MIDAZOLAM HCL 2 MG/2ML IJ SOLN
INTRAMUSCULAR | Status: DC | PRN
  Administered 2021-11-24 (×2): .5 mg via INTRAVENOUS

## 2021-11-24 MED ORDER — FENTANYL CITRATE (PF) 100 MCG/2ML IJ SOLN
INTRAMUSCULAR | Status: DC | PRN
  Administered 2021-11-24: 12.5 ug via INTRAVENOUS

## 2021-11-24 MED ORDER — FENTANYL CITRATE (PF) 100 MCG/2ML IJ SOLN
INTRAMUSCULAR | Status: AC
  Filled 2021-11-24: qty 2

## 2021-11-24 MED ORDER — MIDAZOLAM HCL 2 MG/2ML IJ SOLN
INTRAMUSCULAR | Status: AC
  Filled 2021-11-24: qty 2

## 2021-11-24 NOTE — Care Management (Signed)
Patient was vomiting both times TOC attempted to speak with him.  Will contact family

## 2021-11-24 NOTE — Progress Notes (Signed)
Progress note:  As per chart review, patient is scheduled for CT-guided biopsy in IR today.  Patient remains n.p.o. for procedure.    After reviewing the patient's chart, epic notes, labs, and imaging, I assessed the patient at bedside.  Patient is resting comfortably in bed.  Nurse is at bedside administering IV Zofran.  Nurse shares that patient became nauseous and vomited once just prior to her administering this dose of Zofran.  Patient nods his head in agreement.  Patient's family member is at bedside but on the phone. Wife is not at bedside currently.  When asked if the patient was in pain or had any other symptoms that were not being well managed he shook his head no.  As with my visit visit yesterday, patient is a man of few words.    I conveyed that the plan for today is for him to receive the CT-guided biopsy to help determine origin of his widespread cancer.  Patient nodded in agreement.  I asked if he had any questions regarding this procedure and he shook his head no.  In reviewing the patient's medication history over the last 24 hours he has received 3 doses of Zofran, 1 dose of compazine, and 1 dose of Phenergan.  My recommendation would be to schedule Zofran every 6 hours to give patient a baseline coverage for nausea/vomiting.  Phenergan would then be available to layer on top of baseline of Zofran.  Discussed with Dr. Ouida Sills and I am in agreement that his N/V is a mass effect. However, more aggressive, consistent medication management could help decrease the patient's symptom burden.  Palliative medicine team will continue to follow the patient throughout his hospitalization.  Questions and concerns from patient addressed.  I shared that I will return to visit with the patient tomorrow.  Family member on the phone nodded his head in agreement.  15 minutes  Thomasina Housley L. Ilsa Iha, FNP-BC Palliative Medicine Team Team Phone # [phone]  Greater than 50% of this time was spent  counseling and coordinating care related to the above assessment and plan.

## 2021-11-24 NOTE — Procedures (Signed)
Interventional Radiology Procedure Note  Date of Procedure: 11/24/2021  Procedure: CT guided pancreatic mass biopsy   Findings:  1. Successful CT guided pancreas mass biopsy 18ga x4 passes    Complications: No immediate complications noted.   Estimated Blood Loss: minimal  Follow-up and Recommendations: 1. Bedrest 2 hours    Albin Felling, MD  Vascular & Interventional Radiology  11/24/2021 12:14 PM

## 2021-11-24 NOTE — Progress Notes (Addendum)
PROGRESS NOTE  Jeffrey Carr    DOB: 09/29/1951, 70 y.o.  NIO:[id number]  PCP: Jeffrey Pitch, MD   Code Status: Full Code   DOA: 11/15/2021   LOS: 1  Brief Narrative of Current Hospitalization  Jeffrey Carr is a 70 y.o. male with a PMH significant for recently diagnosed pancreatic mass, HTN, CVA, sleep apnea, hypotestosteronemia, HLD.  Which was discovered by chart review in chart to be merged by the name of Jeffrey Carr. They presented from home to the ED on 11/16/2021 with hematemesis x 10 episodes.  He also had several months of unintended weight loss.  Was undergoing work-up outpatient for pancreatic mass 12/20.  In the ED, it was found that they had hemoglobin 16.5 and stable vital signs. They were treated with IV fluids, antiemetics and pain control.  GI was consulted for further evaluation and suspected Mallory-Weiss tears from vomiting. Heme/ONC consulted for further work-up of pancreatic mass as this is likely causing mass-effect and contributing to patient's presenting symptoms.  Patient was admitted to medicine service for further workup and management of intractable vomiting from pancreatic mass as outlined in detail below.  11/24/21 -stable  Assessment & Plan  Principal Problem:   Intractable vomiting Active Problems:   Pancreatic mass   Pancreatic cancer (HCC)   Protein-calorie malnutrition, severe   Hematemesis with nausea   Intractable nausea and vomiting  Intractable vomiting-patient was able to tolerate some broth yesterday but later vomited.  He is n.p.o. this morning for procedure and denies nausea. -GI following, appreciate recommendations -Antiemetics as needed -IV Protonix -Analgesia as needed  Pancreatic mass- tissue biopsy planned for today, per chart review. Likely CT guided by IR. -Oncology following, appreciate recommendations -Palliative care following, appreciate recommendations -Nutritional supplementation analgesia.  HTN-moderately well  controlled off home dose of lisinopril -Monitor and restart lisinopril as needed  HLD- -Continue home atorvastatin when tolerating p.o.  DVT prophylaxis: SCDs Start: 11/22/21 0251   Diet:  Diet Orders (From admission, onward)     Start     Ordered   11/24/21 0001  Diet NPO time specified Except for: Sips with Meds  Diet effective midnight       Question:  Except for  Answer:  Jeffrey Carr with Meds   11/23/21 1635            Subjective 11/24/21    Pt reports not feeling well and having hiccups overnight so he was unable to sleep.  Denies nausea this morning.  Disposition Plan & Communication  Patient status: Inpatient  Admitted From: Home Disposition: TBD Anticipated discharge date: TBD  Family Communication: step-son at bedside  Consults, Procedures, Significant Events  Consultants:  Oncology GI   Procedures/significant events:  Pancreatic Bx planned 12/28  Antimicrobials:  Anti-infectives (From admission, onward)    None       Objective   Vitals:   11/23/21 1545 11/23/21 2016 11/24/21 0014 11/24/21 0417  BP: 135/78 135/79 (!) 150/77 (!) 146/85  Pulse: 84 87 86 86  Resp: 18 16 16 14   Temp: 98.4 F (36.9 C) 98.6 F (37 C) 98.5 F (36.9 C) 98 F (36.7 C)  TempSrc:  Oral Oral Oral  SpO2: 95% 96% 97% 99%  Weight:      Height:        Intake/Output Summary (Last 24 hours) at 11/24/2021 0710 Last data filed at 11/24/2021 0300 Gross per 24 hour  Intake 290.11 ml  Output 1000 ml  Net -709.89 ml   [employer]  11/01/2021 2259  Weight: 62 kg    Patient BMI: Body mass index is 17.55 kg/m.   Physical Exam:  General: awake, alert, NAD, frail HEENT: atraumatic, clear conjunctiva, anicteric sclera, dry mucous membranes, hearing grossly normal, hoarse voice Respiratory: normal respiratory effort. Cardiovascular:  quick capillary refill  Gastrointestinal: soft, NT, ND Nervous: A&O x3. no gross focal neurologic deficits No hiccups during  encounter Extremities: moves all equally, no edema, normal tone Skin: dry, intact, normal temperature, normal color. No rashes, lesions or ulcers on exposed skin Psychiatry: flat mood  Labs   I have personally reviewed following labs and imaging studies Admission on 11/20/2021  Component Date Value Ref Range Status   Lipase 11/25/2021 87 (H)  11 - 51 U/L Final   Sodium 11/19/2021 139  135 - 145 mmol/L Final   Potassium 11/08/2021 3.5  3.5 - 5.1 mmol/L Final   Chloride 11/13/2021 95 (L)  98 - 111 mmol/L Final   CO2 11/01/2021 29  22 - 32 mmol/L Final   Glucose, Bld 11/03/2021 137 (H)  70 - 99 mg/dL Final   BUN 11/18/2021 16  8 - 23 mg/dL Final   Creatinine, Ser 11/07/2021 1.24  0.61 - 1.24 mg/dL Final   Calcium 11/25/2021 9.5  8.9 - 10.3 mg/dL Final   Total Protein 11/15/2021 7.8  6.5 - 8.1 g/dL Final   Albumin 11/06/2021 3.9  3.5 - 5.0 g/dL Final   AST 11/06/2021 17  15 - 41 U/L Final   ALT 11/20/2021 17  0 - 44 U/L Final   Alkaline Phosphatase 11/04/2021 95  38 - 126 U/L Final   Total Bilirubin 11/05/2021 1.6 (H)  0.3 - 1.2 mg/dL Final   GFR, Estimated 11/06/2021 >60  >60 mL/min Final   Anion gap 11/12/2021 15  5 - 15 Final   WBC 11/14/2021 14.5 (H)  4.0 - 10.5 K/uL Final   RBC 11/23/2021 5.72  4.22 - 5.81 MIL/uL Final   Hemoglobin 11/07/2021 16.5  13.0 - 17.0 g/dL Final   HCT 10/30/2021 48.6  39.0 - 52.0 % Final   MCV 11/09/2021 85.0  80.0 - 100.0 fL Final   MCH 11/15/2021 28.8  26.0 - 34.0 pg Final   MCHC 11/01/2021 34.0  30.0 - 36.0 g/dL Final   RDW 11/03/2021 13.1  11.5 - 15.5 % Final   Platelets 11/23/2021 213  150 - 400 K/uL Final   nRBC 11/08/2021 0.0  0.0 - 0.2 % Final   Lactic Acid, Venous 10/28/2021 1.5  0.5 - 1.9 mmol/L Final   Troponin I (High Sensitivity) 11/14/2021 8  <18 ng/L Final   Blood Bank Specimen 11/08/2021 SAMPLE AVAILABLE FOR TESTING   Final   Sample Expiration 11/06/2021    Final                   Value:11/24/2021,2359 Performed at [facility], [address] [place], [place] [address]    Magnesium 11/06/2021 2.5 (H)  1.7 - 2.4 mg/dL Final   SARS Coronavirus 2 by RT PCR 11/12/2021 NEGATIVE  NEGATIVE Final   Influenza A by PCR 11/16/2021 NEGATIVE  NEGATIVE Final   Influenza B by PCR 11/24/2021 NEGATIVE  NEGATIVE Final   Sodium 11/22/2021 142  135 - 145 mmol/L Final   Potassium 11/22/2021 3.7  3.5 - 5.1 mmol/L Final   Chloride 11/22/2021 100  98 - 111 mmol/L Final   CO2 11/22/2021 26  22 - 32 mmol/L Final   Glucose, Bld 11/22/2021 131 (  H)  70 - 99 mg/dL Final   BUN 11/22/2021 18  8 - 23 mg/dL Final   Creatinine, Ser 11/22/2021 1.11  0.61 - 1.24 mg/dL Final   Calcium 11/22/2021 8.9  8.9 - 10.3 mg/dL Final   GFR, Estimated 11/22/2021 >60  >60 mL/min Final   Anion gap 11/22/2021 16 (H)  5 - 15 Final   WBC 11/22/2021 14.9 (H)  4.0 - 10.5 K/uL Final   RBC 11/22/2021 5.26  4.22 - 5.81 MIL/uL Final   Hemoglobin 11/22/2021 15.2  13.0 - 17.0 g/dL Final   HCT 11/22/2021 44.4  39.0 - 52.0 % Final   MCV 11/22/2021 84.4  80.0 - 100.0 fL Final   MCH 11/22/2021 28.9  26.0 - 34.0 pg Final   MCHC 11/22/2021 34.2  30.0 - 36.0 g/dL Final   RDW 11/22/2021 13.0  11.5 - 15.5 % Final   Platelets 11/22/2021 199  150 - 400 K/uL Final   nRBC 11/22/2021 0.0  0.0 - 0.2 % Final   WBC 11/23/2021 20.6 (H)  4.0 - 10.5 K/uL Final   RBC 11/23/2021 5.37  4.22 - 5.81 MIL/uL Final   Hemoglobin 11/23/2021 15.4  13.0 - 17.0 g/dL Final   HCT 11/23/2021 45.7  39.0 - 52.0 % Final   MCV 11/23/2021 85.1  80.0 - 100.0 fL Final   MCH 11/23/2021 28.7  26.0 - 34.0 pg Final   MCHC 11/23/2021 33.7  30.0 - 36.0 g/dL Final   RDW 11/23/2021 13.2  11.5 - 15.5 % Final   Platelets 11/23/2021 154  150 - 400 K/uL Final   nRBC 11/23/2021 0.0  0.0 - 0.2 % Final   Sodium 11/23/2021 141  135 - 145 mmol/L Final   Potassium 11/23/2021 4.0  3.5 - 5.1 mmol/L Final   Chloride 11/23/2021 102  98 - 111 mmol/L Final   CO2 11/23/2021 26  22 - 32 mmol/L Final   Glucose, Bld  11/23/2021 104 (H)  70 - 99 mg/dL Final   BUN 11/23/2021 17  8 - 23 mg/dL Final   Creatinine, Ser 11/23/2021 0.94  0.61 - 1.24 mg/dL Final   Calcium 11/23/2021 8.7 (L)  8.9 - 10.3 mg/dL Final   GFR, Estimated 11/23/2021 >60  >60 mL/min Final   Anion gap 11/23/2021 13  5 - 15 Final   Prothrombin Time 11/23/2021 12.8  11.4 - 15.2 seconds Final   INR 11/23/2021 1.0  0.8 - 1.2 Final    Imaging Studies  DG Abdomen Acute W/Chest  Result Date: 11/22/2021 CLINICAL DATA:  Vomiting EXAM: DG ABDOMEN ACUTE WITH 1 VIEW CHEST COMPARISON:  None. FINDINGS: There is no evidence of dilated bowel loops or free intraperitoneal air. No radiopaque calculi or other significant radiographic abnormality is seen. Heart size and mediastinal contours are within normal limits. Both lungs are clear. IMPRESSION: Negative abdominal radiographs.  No acute cardiopulmonary disease. Electronically Signed   By: Ulyses Jarred M.D.   On: 11/22/2021 00:46    Medications   Scheduled Meds:  feeding supplement  1 Container Oral TID BM   multivitamin with minerals  1 tablet Oral Daily   pantoprazole (PROTONIX) IV  40 mg Intravenous Q12H   No recently discontinued medications to reconcile  LOS: 1 day   Richarda Osmond, DO [facility] 11/24/2021, 7:10 AM   Available by Epic secure chat 7AM-7PM. If 7PM-7AM, please contact night-coverage Refer to [email or web address] to contact the [facility] Attending or Consulting provider for this pt

## 2021-11-24 NOTE — Progress Notes (Signed)
Patient tolerated procedure well. Alert and oriented at end of procedure. Patient taken back to room 119 where his family is waiting for his return. Answered their questions and educated them on the use of "MyChart". They verbalized understanding and were appreciative of the information. Nurse Shon given report via telephone as well as in person on our return to room. Patient made comfortable in bed with pillow of his liking before this RN left the room.

## 2021-11-24 NOTE — Telephone Encounter (Signed)
EUS scheduled for December 02, 2021. Called and left voicemail to return call for instructions.

## 2021-11-25 ENCOUNTER — Inpatient Hospital Stay: Payer: PPO

## 2021-11-25 ENCOUNTER — Inpatient Hospital Stay: Payer: Self-pay

## 2021-11-25 DIAGNOSIS — E43 Unspecified severe protein-calorie malnutrition: Secondary | ICD-10-CM | POA: Diagnosis not present

## 2021-11-25 DIAGNOSIS — Z66 Do not resuscitate: Secondary | ICD-10-CM

## 2021-11-25 DIAGNOSIS — R1110 Vomiting, unspecified: Secondary | ICD-10-CM | POA: Diagnosis not present

## 2021-11-25 DIAGNOSIS — R112 Nausea with vomiting, unspecified: Secondary | ICD-10-CM | POA: Diagnosis not present

## 2021-11-25 DIAGNOSIS — C251 Malignant neoplasm of body of pancreas: Secondary | ICD-10-CM

## 2021-11-25 DIAGNOSIS — R64 Cachexia: Secondary | ICD-10-CM | POA: Diagnosis not present

## 2021-11-25 DIAGNOSIS — K920 Hematemesis: Secondary | ICD-10-CM | POA: Diagnosis not present

## 2021-11-25 DIAGNOSIS — K8689 Other specified diseases of pancreas: Secondary | ICD-10-CM | POA: Diagnosis not present

## 2021-11-25 LAB — COMPREHENSIVE METABOLIC PANEL
ALT: 16 U/L (ref 0–44)
AST: 21 U/L (ref 15–41)
Albumin: 3.2 g/dL — ABNORMAL LOW (ref 3.5–5.0)
Alkaline Phosphatase: 79 U/L (ref 38–126)
Anion gap: 16 — ABNORMAL HIGH (ref 5–15)
BUN: 26 mg/dL — ABNORMAL HIGH (ref 8–23)
CO2: 26 mmol/L (ref 22–32)
Calcium: 8.7 mg/dL — ABNORMAL LOW (ref 8.9–10.3)
Chloride: 99 mmol/L (ref 98–111)
Creatinine, Ser: 1.07 mg/dL (ref 0.61–1.24)
GFR, Estimated: >60 mL/min (ref >60)
Glucose, Bld: 131 mg/dL — ABNORMAL HIGH (ref 70–99)
Potassium: 3.1 mmol/L — ABNORMAL LOW (ref 3.5–5.1)
Sodium: 141 mmol/L (ref 135–145)
Total Bilirubin: 1.4 mg/dL — ABNORMAL HIGH (ref 0.3–1.2)
Total Protein: 6.5 g/dL (ref 6.5–8.1)

## 2021-11-25 LAB — GLUCOSE, CAPILLARY
Glucose-Capillary: 132 mg/dL — ABNORMAL HIGH (ref 70–99)
Glucose-Capillary: 129 mg/dL — ABNORMAL HIGH (ref 70–99)
Glucose-Capillary: 116 mg/dL — ABNORMAL HIGH (ref 70–99)
Glucose-Capillary: 154 mg/dL — ABNORMAL HIGH (ref 70–99)

## 2021-11-25 LAB — PHOSPHORUS: Phosphorus: 3.7 mg/dL (ref 2.5–4.6)

## 2021-11-25 LAB — MAGNESIUM: Magnesium: 2.2 mg/dL (ref 1.7–2.4)

## 2021-11-25 MED ORDER — GLYCOPYRROLATE 0.2 MG/ML IJ SOLN
0.2000 mg | Freq: Once | INTRAMUSCULAR | Status: AC
  Administered 2021-11-26: 0.2 mg via INTRAVENOUS
  Filled 2021-11-25: qty 1

## 2021-11-25 MED ORDER — CHLORHEXIDINE GLUCONATE CLOTH 2 % EX PADS
6.0000 | MEDICATED_PAD | Freq: Every day | CUTANEOUS | Status: DC
  Administered 2021-11-25: 21:00:00 6 via TOPICAL

## 2021-11-25 MED ORDER — INSULIN ASPART 100 UNIT/ML IJ SOLN
0.0000 [IU] | Freq: Three times a day (TID) | INTRAMUSCULAR | Status: DC
  Administered 2021-11-26: 2 [IU] via SUBCUTANEOUS
  Filled 2021-11-25: qty 1

## 2021-11-25 MED ORDER — HALOPERIDOL LACTATE 5 MG/ML IJ SOLN
1.0000 mg | Freq: Four times a day (QID) | INTRAMUSCULAR | Status: DC | PRN
  Administered 2021-11-25 – 2021-11-26 (×3): 1 mg via INTRAVENOUS
  Filled 2021-11-25 (×3): qty 1

## 2021-11-25 MED ORDER — INSULIN ASPART 100 UNIT/ML IJ SOLN
0.0000 [IU] | INTRAMUSCULAR | Status: DC
  Administered 2021-11-25: 20:00:00 1 [IU] via SUBCUTANEOUS
  Filled 2021-11-25 (×2): qty 1

## 2021-11-25 MED ORDER — TRAVASOL 10 % IV SOLN
INTRAVENOUS | Status: DC
  Filled 2021-11-25: qty 528

## 2021-11-25 MED ORDER — POTASSIUM CHLORIDE IN NACL 40-0.9 MEQ/L-% IV SOLN
INTRAVENOUS | Status: DC
  Filled 2021-11-25 (×2): qty 1000

## 2021-11-25 MED ORDER — LACTATED RINGERS IV SOLN: INTRAVENOUS | Status: DC

## 2021-11-25 MED ORDER — ONDANSETRON HCL 4 MG PO TABS: 4.0000 mg | ORAL_TABLET | Freq: Four times a day (QID) | ORAL | Status: DC

## 2021-11-25 MED ORDER — ONDANSETRON HCL 4 MG/2ML IJ SOLN
4.0000 mg | Freq: Four times a day (QID) | INTRAMUSCULAR | Status: DC
  Administered 2021-11-25 – 2021-11-27 (×8): 4 mg via INTRAVENOUS
  Filled 2021-11-25 (×8): qty 2

## 2021-11-25 MED ORDER — MORPHINE SULFATE (PF) 2 MG/ML IV SOLN
1.0000 mg | INTRAVENOUS | Status: DC | PRN
  Administered 2021-11-25 – 2021-11-26 (×5): 1 mg via INTRAVENOUS
  Filled 2021-11-25 (×6): qty 1

## 2021-11-25 MED ORDER — THIAMINE HCL 100 MG/ML IJ SOLN
100.0000 mg | INTRAMUSCULAR | Status: DC
  Administered 2021-11-25: 100 mg via INTRAVENOUS
  Filled 2021-11-25: qty 2

## 2021-11-25 MED ORDER — SODIUM CHLORIDE 0.9% FLUSH: 10.0000 mL | INTRAVENOUS | Status: DC | PRN

## 2021-11-25 MED ORDER — SODIUM CHLORIDE 0.9% FLUSH
10.0000 mL | Freq: Two times a day (BID) | INTRAVENOUS | Status: DC
Start: 2021-11-25 — End: 2021-11-26

## 2021-11-25 NOTE — Consult Note (Signed)
Patient ID: Jeffrey Carr, male   DOB: Feb 25, 1951, 70 y.o.   MRN: [id number]  HPI Jeffrey Carr is a 70 y.o. male seen in consultation at the request of Dr. Ouida Sills.  Patient recently diagnosed with a pancreatic mass in the body and tail.  Further work-up to include MRI and CT scan personally reviewed showing evidence of extension of the mass into the duodenum and the stomach.  He has intractable nausea and vomiting and has severe malnutrition.  He has developed encephalopathy and is only able to answer very simple questions.  He is currently not competent and does not have the capacity to make his own decisions.  I met with his wife at the bedside and also multiple family members. Recent CT guided biopsy shows evidence of carcinoma.  This is preliminary results. I initially tried to ask him questions but he could not answer complex questions.  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  He has had multiple episodes of hematemesis. He Is currently DNR His recent CT and x-ray does not show evidence of gastric outlet obstruction  HPI  Past Medical History:  Diagnosis Date   Cancer ([place])    pancreatic mass   Left inguinal hernia    Sleep apnea    Stroke [facility])    reportedly had intracranial bleed    Past Surgical History:  Procedure Laterality Date   BRAIN SURGERY      History reviewed. No pertinent family history.  Social History Social History   Tobacco Use   Smoking status: Never   Smokeless tobacco: Never  Substance Use Topics   Alcohol use: Not Currently   Drug use: Not Currently    No Known Allergies  Current Facility-Administered Medications  Medication Dose Route Frequency Provider Last Rate Last Admin   0.9 %  sodium chloride infusion   Intravenous Continuous PRN Abd-El-Barr, Rogue Jury, MD   Stopping Infusion hung by another clincian at 11/24/21 1200   0.9 % NaCl with KCl 40 mEq / L  infusion   Intravenous Continuous Darrick Penna, RPH 75 mL/hr at 11/25/21 1412 New Bag at 11/25/21 1412   acetaminophen (TYLENOL) tablet 650 mg  650 mg Oral Q6H PRN Athena Masse, MD       Or   acetaminophen (TYLENOL) suppository 650 mg  650 mg Rectal Q6H PRN Athena Masse, MD       Chlorhexidine Gluconate Cloth 2 % PADS 6 each  6 each Topical Daily Richarda Osmond, MD       fentaNYL (SUBLIMAZE) injection   Intravenous PRN Abd-El-Barr, Rogue Jury, MD   12.5 mcg at 11/24/21 1145   haloperidol lactate (HALDOL) injection 1 mg  1 mg Intravenous Q6H PRN Jordan Hawks, FNP   1 mg at 11/25/21 1409   insulin aspart (novoLOG) injection 0-6 Units  0-6 Units Subcutaneous Q4H Richarda Osmond, MD       [START ON 11/26/2021] insulin aspart (novoLOG) injection 0-9 Units  0-9 Units Subcutaneous Q8H Darrick Penna, RPH       LORazepam (ATIVAN) injection 0.5 mg  0.5 mg Intravenous Q8H PRN Earlie Server, MD   0.5 mg at 11/24/21 2100   midazolam (VERSED) injection   Intravenous PRN Abd-El-Barr, Rogue Jury, MD   0.5 mg at 11/24/21 1155   morphine 2 MG/ML injection 1 mg  1 mg Intravenous Q2H PRN Jordan Hawks, FNP   1 mg at 11/25/21 1814   ondansetron (ZOFRAN)  tablet 4 mg  4 mg Oral Q6H Jordan Hawks, FNP       Or   ondansetron [facility]) injection 4 mg  4 mg Intravenous Q6H Jordan Hawks, FNP   4 mg at 11/25/21 1559   pantoprazole (PROTONIX) injection 40 mg  40 mg Intravenous Q12H Vonda Antigua B, MD   40 mg at 11/25/21 0808   promethazine (PHENERGAN) 12.5 mg in sodium chloride 0.9 % 50 mL IVPB  12.5 mg Intravenous Q6H PRN Earlie Server, MD 200 mL/hr at 11/25/21 0825 12.5 mg at 11/25/21 0825   sodium chloride flush (NS) 0.9 % injection 10-40 mL  10-40 mL Intracatheter Q12H Richarda Osmond, MD       sodium chloride flush (NS) 0.9 % injection 10-40 mL  10-40 mL Intracatheter PRN Richarda Osmond, MD       thiamine (B-1) injection 100 mg  100 mg Intravenous Q24H Darrick Penna, RPH   100 mg at 11/25/21 1814   TPN ADULT (ION)   Intravenous  Continuous TPN Darrick Penna, RPH 40 mL/hr at 11/25/21 1818 New Bag at 11/25/21 1818     Review of Systems Full ROS  was asked and was negative except for the information on the HPI  Physical Exam Blood pressure 130/75, pulse 96, temperature 98.4 F (36.9 C), temperature source Oral, resp. rate 16, height 6' 2"  (1.88 m), weight 62 kg, SpO2 (!) 87 %. CONSTITUTIONAL: NAD, cachectic. EYES: Pupils are equal, round, and reactive to light, Sclera are non-icteric. EARS, NOSE, MOUTH AND THROAT: The oropharynx is clear. The oral mucosa is pink and moist. Hearing is intact to voice. LYMPH NODES:  Lymph nodes in the neck are normal. RESPIRATORY:  Lungs are clear. There is normal respiratory effort, with equal breath sounds bilaterally, and without pathologic use of accessory muscles. CARDIOVASCULAR: Heart is regular without murmurs, gallops, or rubs. GI: The abdomen is  soft, mild tenderness palpation without peritonitis, and nondistended. There are no palpable masses. There is no hepatosplenomegaly. There are normal bowel sounds in all quadrants. GU: Rectal deferred.   MUSCULOSKELETAL: Normal muscle strength and tone. No cyanosis or edema.   SKIN: Turgor is good and there are no pathologic skin lesions or ulcers. NEUROLOGIC: Motor and sensation is grossly normal. Cranial nerves are grossly intact. PSYCH: He is alert and awake.  He is only able to answer very simple questions.  He does not have medical capacity.  He cannot elaborate for a further discussion regarding DNR/DNI.  Does not have any focal neurological deficits  Data Reviewed  I have personally reviewed the patient's imaging, laboratory findings and medical records.    Assessment/Plan 70 year old male with a large pancreatic mass likely carcinoma with metastatic disease involving the stomach and duodenum.  I was asked to evaluate him for jejunostomy tube.  I had a lengthy discussion with family.  Currently the patient is not able to  make his own decision and is not competent due to encephalopathy and progression of his disease.  After extensive discussion with the family about the overall prognosis the do not want to move forward with any type of surgical tube that will prolong his life.  They understand that this will be only provide nutrition enterally I would not address his metastatic disease. They wish to stay with short-term TPN and do palliative care and hospice.  They understand the poor prognosis of his disease process.  Please note I spent greater than 80 minutes in this encounter including coordination of  his care and counseling the family extensively about end-of-life issues. We will be available.  Caroleen Hamman, MD FACS General Surgeon 11/25/2021, 8:03 PM

## 2021-11-25 NOTE — Consult Note (Addendum)
PHARMACY - TOTAL PARENTERAL NUTRITION CONSULT NOTE   Indication: Severe malnutrition in context of chronic illness  Patient Measurements: Height: 6' 2"  (188 cm) Weight: 62 kg (136 lb 11 oz) IBW/kg (Calculated) : 82.2 TPN AdjBW (KG): 62 Body mass index is 17.55 kg/m. Usual Weight: ~175lbs  Assessment:  70 y.o. male with medical history significant for OSA, ICH, inguinal hernia and recently diagnosed pancreatic mass (likely advanced pancreatic cancer, seen by oncology on 12/23 with plans for biopsy to establish tissue diagnosis) who presents to the ED for evaluation of vomiting. Pharmacy consulted to initiate TPN in order to meet his estimated nutritional needs. Per RD note, patient is at high refeed risk.  Glucose / Insulin: Glucose running between 100-131 mg/dL Electrolytes: Na 141, K 3.1, T bili 1.4, Albumin 3.2 Ca 8.7, BUN 26, otherwise WNL  Renal: Scr 1.07 (CrCl 54.7 ml/min) Hepatic: AST/ALT/Alk Phos WNL Intake / Output; MIVF: 3170 (in) / 2620 (out); NS w/ K+ 5mq @ 75 ml/hr GI Imaging:  GI Surgeries / Procedures: CT-guided pancreatic tissue biopsy 12/28  Central access: Pending 11/25/21 (PICC order in) TPN start date: Pending 11/25/2021 (upon PICC placement)  Nutritional Goals: Goal TPN rate is 80 mL/hr (provides 105g of protein and 2000 kcals per day)  RD Assessment: Estimated Needs Total Energy Estimated Needs: 2000-2300kcal/day Total Protein Estimated Needs: 100-115g/day Total Fluid Estimated Needs: 1.7-1.9L/day  Current Nutrition:  NPO  Plan:  Start TPN at 40 mL/hr at 1800 (half rate) Glucose: 144 gm Lipids: 33.6 gm Amino Acids: 52.8 gm Fluid: 960 mL  Electrolytes in TPN: Na 573m/L, K 508mL, Ca 5mE23m, Mg 5mEq31m and Phos 15mmo19m Cl:Ac 1:1 Add standard MVI and trace elements to TPN Initiate Sensitive q8h SSI and adjust as needed  Reduce MIVF to 40 mL/hr at 1800 Monitor TPN labs on Mon/Thurs, daily   AndersDarrick Penna/2022,11:12 AM

## 2021-11-25 NOTE — TOC Initial Note (Signed)
Transition of Care [facility]) - Initial/Assessment Note    Patient Details  Name: Jeffrey Carr MRN: [id number] Date of Birth: 10/05/1951  [facility] CM/SW Contact:    Pete Pelt, RN Phone Number: 11/25/2021, 11:09 AM  Clinical Narrative:    Patient has been very ill, and until today, TOC unable to assess.  Spoke to patient's wife today.  She states he was at home, living with her previously.  He had transportation to appointments and to the pharmacy.  Spouse states that she would like to have more answers from the care team prior to making any decisions about him returning home or another disposition, but she prefers to have him return home with proper assistance.  Spouse states she is due to have back surgery but has postponed that at physical therapy due to her spouse's illness.  RNCM discussed the importance of self care to assist with caring for others.  She verbally understood and stated she would do what she could to keep herself healthy.   Spouse states she lives at home with husband, and sons assist when needed, but they work and have families.  She does feel supported by them.  She has relatives who are not geographically close, but is often in touch via phone.  Mrs. Carlyon Shadow states that her husband can take medications as prescribed, but he does not like to take them, and as of late he has had issues swallowing, making this more difficult.  Care team consulted about speech therapy consult.    Spouse states that she received information from son that patient is not sure how much treatment he will want for his current condition, and that he wants to be comfortable.  She states that she would want "treatment that makes sense and that can keep him safe and comfortable".  She would like more information from care team prior to final decision making.    Mrs. Carlyon Shadow states that she believes it is important that patient has Advanced Directives completed, as she understands the need to  have his wishes documented.  She is anxious about suggesting this to patient, but is amenable to having him speak with Chaplain.  Chaplain paged, states she will be in to see patient.  TOC contact information provided to patient's spouse.  TOC to follow          Expected Discharge Plan:  (TBD) Barriers to Discharge: Continued Medical Work up   Patient Goals and CMS Choice        Expected Discharge Plan and Services Expected Discharge Plan:  (TBD) In-house Referral: Chaplain Discharge Planning Services: CM Consult   Living arrangements for the past 2 months: Single Family Home                                      Prior Living Arrangements/Services Living arrangements for the past 2 months: Single Family Home Lives with:: Self, Spouse Patient language and need for interpreter reviewed:: Yes (spoke to wife, no interpreter required) Do you feel safe going back to the place where you live?: Yes (With appropriate assistance)      Need for Family Participation in Patient Care: Yes (Comment) Care giver support system in place?: Yes (comment)   Criminal Activity/Legal Involvement Pertinent to Current Situation/Hospitalization: No - Comment as needed  Activities of Daily Living Home Assistive Devices/Equipment: None ADL Screening (condition at time of admission) Patient's cognitive  ability adequate to safely complete daily activities?: Yes Is the patient deaf or have difficulty hearing?: No Does the patient have difficulty seeing, even when wearing glasses/contacts?: Yes Does the patient have difficulty concentrating, remembering, or making decisions?: No Patient able to express need for assistance with ADLs?: No Does the patient have difficulty dressing or bathing?: No Independently performs ADLs?: Yes (appropriate for developmental age) Does the patient have difficulty walking or climbing stairs?: No Weakness of Legs: None Weakness of Arms/Hands: None  Permission  Sought/Granted Permission sought to share information with : Case Manager                Emotional Assessment Appearance::  (Spoke to wife, patient is too ill)       Alcohol / Substance Use: Not Applicable Psych Involvement: No (comment)  Admission diagnosis:  Intractable vomiting [R11.10] Intractable nausea and vomiting [R11.2] Hematemesis with nausea [K92.0] Pancreatic cancer ([place]) [C25.9] Patient Active Problem List   Diagnosis Date Noted   Pancreatic cancer ([place]) 11/23/2021   Protein-calorie malnutrition, severe 11/23/2021   Hematemesis with nausea    Intractable nausea and vomiting    Intractable vomiting 11/22/2021   Pancreatic mass 11/22/2021   PCP:  Juluis Pitch, MD Pharmacy:   [id number] - [place], [place] - [address] [place] [address] Phone: [phone] Fax: [phone]     Social Determinants of Health (SDOH) Interventions    Readmission Risk Interventions No flowsheet data found.

## 2021-11-25 NOTE — Progress Notes (Signed)
Chaplain Maggie made initial visit per nurse request for visitation and potential Advanced Directive education. Pt's wife at bedside. Palliative Care NP arrived and joined visitation. Pt appeared distraught and miserable. He stated, "I am tired of being tired." The couple is just beginning the conversation related to difficult decision making.White it is hard for patient to talk they are open to discuss minimizing suffering and realistically accepting medical challenges as they present. Pt and his wife believe in God and find prayer and spiritual truths from [place] tradition giving them hope. Love, mercy, and kindness are evident in the family dynamic as pt's wife shares stories about their life together. Continued care and coordination of AD signatures available per on call chaplain.

## 2021-11-25 NOTE — Progress Notes (Signed)
Peripherally Inserted Central Catheter Placement  The IV Nurse has discussed with the patient and/or persons authorized to consent for the patient, the purpose of this procedure and the potential benefits and risks involved with this procedure.  The benefits include less needle sticks, lab draws from the catheter, and the patient may be discharged home with the catheter. Risks include, but not limited to, infection, bleeding, blood clot (thrombus formation), and puncture of an artery; nerve damage and irregular heartbeat and possibility to perform a PICC exchange if needed/ordered by physician.  Alternatives to this procedure were also discussed.  Bard Power PICC patient education guide, fact sheet on infection prevention and patient information card has been provided to patient /or left at bedside.    PICC Placement Documentation  PICC Double Lumen 45/36/46 PICC Right Basilic 40 cm 0 cm (Active)  Indication for Insertion or Continuance of Line Administration of hyperosmolar/irritating solutions (i.e. TPN, Vancomycin, etc.) 11/25/21 1500  Exposed Catheter (cm) 0 cm 11/25/21 1500  Site Assessment Clean;Dry;Intact 11/25/21 1500  Lumen #1 Status Flushed;Blood return noted 11/25/21 1500  Lumen #2 Status Flushed;Blood return noted 11/25/21 1500  Dressing Type Transparent 11/25/21 1500  Dressing Status Clean;Dry;Intact 11/25/21 1500  Antimicrobial disc in place? Yes 11/25/21 1500  Dressing Change Due 12/02/21 11/25/21 1500       Jule Economy Horton 11/25/2021, 3:32 PM

## 2021-11-25 NOTE — Progress Notes (Signed)
Palliative Care Progress Note, Assessment & Plan   Patient Name: Jeffrey Carr       Date: 11/25/2021 DOB: [birth year]  Age: 70 y.o. MRN#: [id number] Attending Physician: Richarda Osmond, MD Primary Care Physician: Juluis Pitch, MD Admit Date: 11/15/2021  Reason for Consultation/Follow-up: Establishing goals of care  Subjective: Patient is sitting up in bed in moderate distress.  He is not in respiratory distress but appears to be hiccuping and almost heaving at times.  His wife is at bedside and speaking with the chaplain at bedside.  Wife endorses patient has vomited 2 times this morning despite efforts to relieve nausea with Zofran and Phenergan.  HPI: Patient is a 69 year old male with a past medical history significant for HTN, CVA, sleep apnea, hypotestosteronemia, HLD, and recently diagnosed with a pancreatic mass.    He presented to the emergency department on 12/25 with hematemesis x10 episodes.  Patient's wife is historian.  She endorses several months of unintended weight loss - approximately 40 pounds.   Patient underwent CT-guided biopsy of pancreatic mass on 12/28.  Patient continues to have intractable nausea with vomiting.  Palliative medicine team was consulted to discuss goals of care.  Summary of counseling/coordination of care: Discussed advance care planning and CODE STATUS.  Risk and benefits of full code versus allowing a natural death reviewed with patient and patient's wife.  Patient shares that if he were to be put on a ventilator then it is "no way to live".  He confirmed he would like to allow a natural death and have a DNR as his CODE STATUS.  We reviewed that the patient's functional status is poor, contributing to the likelihood that he would not do well with  chemoradiation.  Patient's wife shared she does not want him to suffer and wants him to be comfortable.  I reviewed that I have several medications to give to help tamp down his nausea and vomiting.  However, this could come with some central nervous system sedation.  Patient shared he would like to try some morphine and a different nausea medication.  He says he does not want anything to be given permanently but would like to try something different.  Patient complained of the most recent medication for nausea (Phenergan) "messed with his head" and he feels "loopy".  My recommendation is that Haldol and morphine IV are given together.  Patient's biggest complaint is hiccups with stomach pain so hopefully this combination can offer some relief.  Pros and cons of TPN as well as potential need for feeding tube reviewed with patient and wife.  Patient would like to move forward with TPN until he has the results of his biopsy.  Therapeutic space and silence as well as active listening provided for wife and patient to share their thoughts and emotions regarding his most recent diagnosis of stage IV pancreatic cancer.  Chaplain at bedside offered spiritual and emotional support as well.  Code Status: DNR  Prognosis: < 6 months  Discharge Planning: To Be Determined  Recommendations/Plan: DNR Morphine 2 mg every 2 hours as needed to be given with Haldol 1 mg IV every 6 as needed Continue baseline coverage of Zofran  Care plan was  discussed with patient, patient's wife, chaplain, Dr. Ouida Sills, and nurse Debi  Physical Exam Vitals and nursing note reviewed.  Constitutional:      Appearance: He is ill-appearing and toxic-appearing.  HENT:     Head: Normocephalic and atraumatic.     Mouth/Throat:     Mouth: Mucous membranes are dry.  Cardiovascular:     Rate and Rhythm: Normal rate.     Pulses: Normal pulses.  Pulmonary:     Effort: Pulmonary effort is normal.  Abdominal:     Palpations:  Abdomen is soft.  Musculoskeletal:     Comments: Generalized weakness, become nauseous with movement  Skin:    General: Skin is warm and dry.  Neurological:     Mental Status: Mental status is at baseline.  Psychiatric:        Mood and Affect: Mood normal.        Behavior: Behavior normal.        Thought Content: Thought content normal.        Judgment: Judgment normal.               Total Time 35 minutes  Greater than 50%  of this time was spent counseling and coordinating care related to the above assessment and plan.  Thank you for allowing the Palliative Medicine Team to assist in the care of this patient.  [place] Ilsa Iha, FNP-BC Palliative Medicine Team Team Phone # [phone]

## 2021-11-25 NOTE — Progress Notes (Addendum)
Nutrition Follow Up Note   DOCUMENTATION CODES:   Severe malnutrition in context of chronic illness  INTERVENTION:   TPN per pharmacy   Recommend thiamine 174m daily added to TPN x 3 days   Pt at high refeed risk; recommend monitor potassium, magnesium and phosphorus labs daily until stable  Daily weights   NUTRITION DIAGNOSIS:   Severe Malnutrition related to cancer and cancer related treatments as evidenced by severe fat depletion, severe muscle depletion.  GOAL:   Patient will meet greater than or equal to 90% of their needs -not met   MONITOR:   Labs, Weight trends, Skin, I & O's, Diet advancement, Other (Comment) (TPN)  ASSESSMENT:   70y.o. male with medical history significant for OSA, ICH, inguinal hernia and recently diagnosed pancreatic mass (likely advanced pancreatic cancer, seen by oncology on 12/23 with plans for biopsy to establish tissue diagnosis) who presents to the ED for evaluation of vomiting.  Pt continues to have nausea and vomiting. Pancreatic mass may be causing bowel obstruction. Pt s/p CT guided biopsy yesterday. Pt made NPO. Plan is for PICC line and TPN to start this evening. Pt is at high refeed risk. Pt would like to await for biopsy results and discussions with oncology before deciding goals of care.   Medications reviewed and include: insulin, MVI, zofran, protonix, phenergan  Labs reviewed: K 3.1(L), BUN 26(H), P 3.7 wnl, Mg 2.2 wnl Wbc- 20.6(H)- 12/27 Cbgs- 129, 132 x 24 hrs  Diet Order:   Diet Order             Diet NPO time specified Except for: Sips with Meds  Diet effective midnight                  EDUCATION NEEDS:   Education needs have been addressed  Skin:  Skin Assessment: Reviewed RN Assessment  Last BM:  12/25- No BM in 4 days; MD aware   Height:   Ht Readings from Last 1 Encounters:  10/28/2021 6' 2"  (1.88 m)    Weight:   Wt Readings from Last 1 Encounters:  11/24/2021 62 kg    Ideal Body Weight:   86.3 kg  BMI:  Body mass index is 17.55 kg/m.  Estimated Nutritional Needs:   Kcal:  2000-2300kcal/day  Protein:  100-115g/day  Fluid:  1.7-1.9L/day  CKoleen DistanceMS, RD, LDN Please refer to [facility] LLCfor RD and/or RD on-call/weekend/after hours pager

## 2021-11-25 NOTE — Progress Notes (Signed)
PROGRESS NOTE  Jeffrey Carr    DOB: November 09, 1951, 70 y.o.  ZOX:[id number]  PCP: Juluis Pitch, MD   Code Status: Full Code   DOA: 11/12/2021   LOS: 2  Brief Narrative of Current Hospitalization  Jeffrey Carr is a 70 y.o. male with a PMH significant for recently diagnosed pancreatic mass, HTN, CVA, sleep apnea, hypotestosteronemia, HLD.  Which was discovered by chart review in chart to be merged by the name of [name]. They presented from home to the ED on 11/11/2021 with hematemesis x 10 episodes.  He also had several months of unintended weight loss.  Was undergoing work-up outpatient for pancreatic mass 12/20.  In the ED, it was found that they had hemoglobin 16.5 and stable vital signs. They were treated with IV fluids, antiemetics and pain control.  GI was consulted for further evaluation and suspected Mallory-Weiss tears from vomiting. Heme/ONC consulted for further work-up of pancreatic mass as this is likely causing mass-effect and contributing to patient's presenting symptoms.  Patient was admitted to medicine service for further workup and management of intractable vomiting from pancreatic mass as outlined in detail below.  11/25/21 -stable, continues to have intractable N/V  Assessment & Plan  Principal Problem:   Intractable vomiting Active Problems:   Pancreatic mass   Pancreatic cancer (HCC)   Protein-calorie malnutrition, severe   Hematemesis with nausea   Intractable nausea and vomiting  Intractable nausea, vomiting-  Severe calorie malnutrition-unable to tolerate PO at all. Has been several months of weight loss and decreased energy. Severe muscle wasting on exam. Generalized weakness.  -GI following, appreciate recommendations -Antiemetics as needed -IV Protonix -Analgesia as needed - continue IV hydration  - adding K+ repletion today - initiating TPN today, PICC line ordered  - appreciate monitoring by pharmacy  - monitor for refeeding  syndrome  Pancreatic mass- tissue biopsy 12/28. -Oncology following, appreciate recommendations - disposition pending treatment plan from heme/onc -Palliative care following, appreciate recommendations -Nutritional supplementation  - analgesia PRN  HTN-moderately well controlled off home dose of lisinopril -Monitor and restart lisinopril as needed  HLD- -Continue home atorvastatin when tolerating p.o.  DVT prophylaxis: SCDs Start: 11/22/21 0251   Diet:  Diet Orders (From admission, onward)     Start     Ordered   11/24/21 0001  Diet NPO time specified Except for: Sips with Meds  Diet effective midnight       Question:  Except for  Answer:  Ferrel Logan with Meds   11/23/21 1635            Subjective 11/25/21    Pt reports continued hiccups. Not able to tolerate any PO. Continued nausea with no vomiting today.   Disposition Plan & Communication  Patient status: Inpatient  Admitted From: Home Disposition: TBD Anticipated discharge date: TBD  Family Communication: wife at bedside  Consults, Procedures, Significant Events  Consultants:  Oncology GI   Procedures/significant events:  Pancreatic Bx 12/28  Antimicrobials:  Anti-infectives (From admission, onward)    None       Objective   Vitals:   11/24/21 1221 11/24/21 1627 11/24/21 1945 11/25/21 0342  BP: (!) 132/59 (!) 152/68 (!) 147/79 137/72  Pulse: 82 91 94 100  Resp: 12 20 16 16   Temp:  97.7 F (36.5 C) 98.6 F (37 C) 98 F (36.7 C)  TempSrc:  Oral Oral Oral  SpO2: 100% 98% 93% 95%  Weight:      Height:        Intake/Output  Summary (Last 24 hours) at 11/25/2021 0733 Last data filed at 11/25/2021 0610 Gross per 24 hour  Intake 3170.07 ml  Output 550 ml  Net 2620.07 ml    Filed Weights   11/24/2021 2259  Weight: 62 kg    Patient BMI: Body mass index is 17.55 kg/m.   Physical Exam:  General: awake, alert, NAD, frail HEENT: atraumatic, clear conjunctiva, anicteric sclera, dry mucous  membranes, hearing grossly normal, hoarse voice Respiratory: normal respiratory effort. Cardiovascular:  quick capillary refill  Gastrointestinal: soft, NT, ND Nervous: A&O x3. no gross focal neurologic deficits hiccups during encounter Extremities: moves all equally, no edema, decreased muscle tone Skin: dry, intact, normal temperature, normal color. No rashes, lesions or ulcers on exposed skin Psychiatry: flat mood  Labs   I have personally reviewed following labs and imaging studies Admission on 11/09/2021  Component Date Value Ref Range Status   Lipase 11/20/2021 87 (H)  11 - 51 U/L Final   Sodium 11/22/2021 139  135 - 145 mmol/L Final   Potassium 11/11/2021 3.5  3.5 - 5.1 mmol/L Final   Chloride 11/11/2021 95 (L)  98 - 111 mmol/L Final   CO2 11/05/2021 29  22 - 32 mmol/L Final   Glucose, Bld 11/08/2021 137 (H)  70 - 99 mg/dL Final   BUN 11/19/2021 16  8 - 23 mg/dL Final   Creatinine, Ser 10/28/2021 1.24  0.61 - 1.24 mg/dL Final   Calcium 10/31/2021 9.5  8.9 - 10.3 mg/dL Final   Total Protein 11/23/2021 7.8  6.5 - 8.1 g/dL Final   Albumin 11/06/2021 3.9  3.5 - 5.0 g/dL Final   AST 11/20/2021 17  15 - 41 U/L Final   ALT 11/15/2021 17  0 - 44 U/L Final   Alkaline Phosphatase 11/18/2021 95  38 - 126 U/L Final   Total Bilirubin 11/09/2021 1.6 (H)  0.3 - 1.2 mg/dL Final   GFR, Estimated 11/24/2021 >60  >60 mL/min Final   Anion gap 11/03/2021 15  5 - 15 Final   WBC 11/05/2021 14.5 (H)  4.0 - 10.5 K/uL Final   RBC 11/05/2021 5.72  4.22 - 5.81 MIL/uL Final   Hemoglobin 11/06/2021 16.5  13.0 - 17.0 g/dL Final   HCT 11/14/2021 48.6  39.0 - 52.0 % Final   MCV 11/18/2021 85.0  80.0 - 100.0 fL Final   MCH 11/07/2021 28.8  26.0 - 34.0 pg Final   MCHC 11/20/2021 34.0  30.0 - 36.0 g/dL Final   RDW 11/13/2021 13.1  11.5 - 15.5 % Final   Platelets 10/31/2021 213  150 - 400 K/uL Final   nRBC 11/06/2021 0.0  0.0 - 0.2 % Final   Lactic Acid, Venous 10/30/2021 1.5  0.5 - 1.9 mmol/L Final    Troponin I (High Sensitivity) 11/07/2021 8  <18 ng/L Final   Blood Bank Specimen 11/22/2021 SAMPLE AVAILABLE FOR TESTING   Final   Sample Expiration 11/13/2021    Final                   Value:11/24/2021,2359 Performed at [facility], [address] [place], [place] [address]    Magnesium 11/05/2021 2.5 (H)  1.7 - 2.4 mg/dL Final   SARS Coronavirus 2 by RT PCR 11/06/2021 NEGATIVE  NEGATIVE Final   Influenza A by PCR 11/19/2021 NEGATIVE  NEGATIVE Final   Influenza B by PCR 11/08/2021 NEGATIVE  NEGATIVE Final   Sodium 11/22/2021 142  135 - 145 mmol/L Final   Potassium  11/22/2021 3.7  3.5 - 5.1 mmol/L Final   Chloride 11/22/2021 100  98 - 111 mmol/L Final   CO2 11/22/2021 26  22 - 32 mmol/L Final   Glucose, Bld 11/22/2021 131 (H)  70 - 99 mg/dL Final   BUN 11/22/2021 18  8 - 23 mg/dL Final   Creatinine, Ser 11/22/2021 1.11  0.61 - 1.24 mg/dL Final   Calcium 11/22/2021 8.9  8.9 - 10.3 mg/dL Final   GFR, Estimated 11/22/2021 >60  >60 mL/min Final   Anion gap 11/22/2021 16 (H)  5 - 15 Final   WBC 11/22/2021 14.9 (H)  4.0 - 10.5 K/uL Final   RBC 11/22/2021 5.26  4.22 - 5.81 MIL/uL Final   Hemoglobin 11/22/2021 15.2  13.0 - 17.0 g/dL Final   HCT 11/22/2021 44.4  39.0 - 52.0 % Final   MCV 11/22/2021 84.4  80.0 - 100.0 fL Final   MCH 11/22/2021 28.9  26.0 - 34.0 pg Final   MCHC 11/22/2021 34.2  30.0 - 36.0 g/dL Final   RDW 11/22/2021 13.0  11.5 - 15.5 % Final   Platelets 11/22/2021 199  150 - 400 K/uL Final   nRBC 11/22/2021 0.0  0.0 - 0.2 % Final   WBC 11/23/2021 20.6 (H)  4.0 - 10.5 K/uL Final   RBC 11/23/2021 5.37  4.22 - 5.81 MIL/uL Final   Hemoglobin 11/23/2021 15.4  13.0 - 17.0 g/dL Final   HCT 11/23/2021 45.7  39.0 - 52.0 % Final   MCV 11/23/2021 85.1  80.0 - 100.0 fL Final   MCH 11/23/2021 28.7  26.0 - 34.0 pg Final   MCHC 11/23/2021 33.7  30.0 - 36.0 g/dL Final   RDW 11/23/2021 13.2  11.5 - 15.5 % Final   Platelets 11/23/2021 154  150 - 400 K/uL Final   nRBC  11/23/2021 0.0  0.0 - 0.2 % Final   Sodium 11/23/2021 141  135 - 145 mmol/L Final   Potassium 11/23/2021 4.0  3.5 - 5.1 mmol/L Final   Chloride 11/23/2021 102  98 - 111 mmol/L Final   CO2 11/23/2021 26  22 - 32 mmol/L Final   Glucose, Bld 11/23/2021 104 (H)  70 - 99 mg/dL Final   BUN 11/23/2021 17  8 - 23 mg/dL Final   Creatinine, Ser 11/23/2021 0.94  0.61 - 1.24 mg/dL Final   Calcium 11/23/2021 8.7 (L)  8.9 - 10.3 mg/dL Final   GFR, Estimated 11/23/2021 >60  >60 mL/min Final   Anion gap 11/23/2021 13  5 - 15 Final   Prothrombin Time 11/23/2021 12.8  11.4 - 15.2 seconds Final   INR 11/23/2021 1.0  0.8 - 1.2 Final    Imaging Studies  CT BIOPSY  Result Date: 12-23-2021 INDICATION: Pancreatic mass EXAM: CT BIOPSY MEDICATIONS: None. ANESTHESIA/SEDATION: Moderate (conscious) sedation was employed during this procedure. A total of Versed 1 mg and Fentanyl 12.5 mcg was administered intravenously. Moderate Sedation Time: 25 minutes. The patient's level of consciousness and vital signs were monitored continuously by radiology nursing throughout the procedure under my direct supervision. FLUOROSCOPY TIME:  N/a COMPLICATIONS: None immediate. PROCEDURE: Informed written consent was obtained from the patient after a thorough discussion of the procedural risks, benefits and alternatives. All questions were addressed. Maximal Sterile Barrier Technique was utilized including caps, mask, sterile gowns, sterile gloves, sterile drape, hand hygiene and skin antiseptic. A timeout was performed prior to the initiation of the procedure. The patient was placed supine on the exam table. Limited CT of the  abdomen was performed for planning purposes. This again demonstrated a large mass centered in the pancreatic tail. Skin entry site was marked, and the overlying skin was prepped and draped in a standard sterile fashion. Local analgesia was obtained with 1% lidocaine. Using intermittent CT fluoroscopy, a 17 gauge introducer  needle was advanced just deep to the pancreatic mass. Subsequently, core needle biopsy was performed using a 18 gauge core biopsy device x4 passes. Partially necrotic specimens were retrieved, and submitted in formalin to pathology for further review. All needles were removed and hemostasis was achieved at the skin using manual pressure. Limited postprocedure imaging demonstrated no complicating feature. A clean dressing was placed. The patient tolerated the procedure well without immediate complication. IMPRESSION: Successful CT-guided core needle biopsy of large pancreatic mass. Electronically Signed   By: Albin Felling M.D.   On: 11/24/2021 14:02   DG Abdomen Acute W/Chest  Result Date: 11/22/2021 CLINICAL DATA:  Vomiting EXAM: DG ABDOMEN ACUTE WITH 1 VIEW CHEST COMPARISON:  None. FINDINGS: There is no evidence of dilated bowel loops or free intraperitoneal air. No radiopaque calculi or other significant radiographic abnormality is seen. Heart size and mediastinal contours are within normal limits. Both lungs are clear. IMPRESSION: Negative abdominal radiographs.  No acute cardiopulmonary disease. Electronically Signed   By: Ulyses Jarred M.D.   On: 11/22/2021 00:46    Medications   Scheduled Meds:  feeding supplement  1 Container Oral TID BM   multivitamin with minerals  1 tablet Oral Daily   pantoprazole (PROTONIX) IV  40 mg Intravenous Q12H   No recently discontinued medications to reconcile  LOS: 2 days   Richarda Osmond, DO [facility] 11/25/2021, 7:33 AM   Available by Epic secure chat 7AM-7PM. If 7PM-7AM, please contact night-coverage Refer to [email or web address] to contact the [facility] Attending or Consulting provider for this pt

## 2021-11-25 NOTE — Progress Notes (Addendum)
Hematology/Oncology Progress Note  [phone] [phone]  Patient Care Team: Juluis Pitch, MD as PCP - General (Family Medicine)   Name of the patient: Jeffrey Carr  [id number]  03-Mar-1951  Date of visit: 11/25/21   INTERVAL HISTORY-  S/p CT guided pancreatic mass biopsy. 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 Result is not finalized yet.   Continues to have refractory nausea vomiting and not able to keep any thing down.  Patient's wife is at bedside. Patient does not talk much due to weakness.  Denies pain.    No Known Allergies  Patient Active Problem List   Diagnosis Date Noted   Pancreatic cancer ([place]) 11/23/2021   Protein-calorie malnutrition, severe 11/23/2021   Hematemesis with nausea    Intractable nausea and vomiting    Intractable vomiting 11/22/2021   Pancreatic mass 11/22/2021     Past Medical History:  Diagnosis Date   Cancer [facility])    pancreatic mass   Left inguinal hernia    Sleep apnea    Stroke [facility])    reportedly had intracranial bleed     Past Surgical History:  Procedure Laterality Date   BRAIN SURGERY      Social History   Socioeconomic History   Marital status: Married    Spouse name: Not on file   Number of children: Not on file   Years of education: Not on file   Highest education level: Not on file  Occupational History   Not on file  Tobacco Use   Smoking status: Never   Smokeless tobacco: Never  Substance and Sexual Activity   Alcohol use: Not Currently   Drug use: Not Currently   Sexual activity: Not on file  Other Topics Concern   Not on file  Social History Narrative   Not on file   Social Determinants of Health   Financial Resource Strain: Not on file  Food Insecurity: Not on file  Transportation Needs: Not on file  Physical Activity: Not on file  Stress: Not on file  Social  Connections: Not on file  Intimate Partner Violence: Not on file     History reviewed. No pertinent family history.   Current Facility-Administered Medications:    0.9 %  sodium chloride infusion, , Intravenous, Continuous PRN, Abd-El-Barr, Rogue Jury, MD, Stopping Infusion hung by another clincian at 11/24/21 1200   0.9 % NaCl with KCl 40 mEq / L  infusion, , Intravenous, Continuous, Richarda Osmond, MD   acetaminophen (TYLENOL) tablet 650 mg, 650 mg, Oral, Q6H PRN **OR** acetaminophen (TYLENOL) suppository 650 mg, 650 mg, Rectal, Q6H PRN, Athena Masse, MD   feeding supplement (BOOST / RESOURCE BREEZE) liquid 1 Container, 1 Container, Oral, TID BM, Hosie Poisson, MD   fentaNYL (SUBLIMAZE) injection, , Intravenous, PRN, Abd-El-Barr, Rogue Jury, MD, 12.5 mcg at 11/24/21 1145   haloperidol lactate (HALDOL) injection 1 mg, 1 mg, Intravenous, Q6H PRN, Jordan Hawks, FNP   insulin aspart (novoLOG) injection 0-6 Units, 0-6 Units, Subcutaneous, Q4H, Anderson, Carlynn Herald, MD   [START ON 11/26/2021] insulin aspart (novoLOG) injection 0-9 Units, 0-9 Units, Subcutaneous, Q8H, Darrick Penna, RPH   LORazepam (ATIVAN) injection 0.5 mg, 0.5 mg, Intravenous, Q8H PRN, Earlie Server, MD, 0.5 mg at 11/24/21 2100   midazolam (VERSED) injection, , Intravenous, PRN, Abd-El-Barr, Rogue Jury, MD, 0.5 mg at 11/24/21 1155   morphine 2 MG/ML injection 2 mg, 2 mg, Intravenous, Q2H PRN, Athena Masse,  MD   multivitamin with minerals tablet 1 tablet, 1 tablet, Oral, Daily, Hosie Poisson, MD, 1 tablet at 11/25/21 0810   ondansetron (ZOFRAN) tablet 4 mg, 4 mg, Oral, Q6H **OR** ondansetron (ZOFRAN) injection 4 mg, 4 mg, Intravenous, Q6H, Jordan Hawks, FNP, 4 mg at 11/25/21 0928   pantoprazole (PROTONIX) injection 40 mg, 40 mg, Intravenous, Q12H, Tahiliani, Varnita B, MD, 40 mg at 11/25/21 0808   promethazine (PHENERGAN) 12.5 mg in sodium chloride 0.9 % 50 mL IVPB, 12.5 mg, Intravenous, Q6H PRN, Earlie Server, MD, Last Rate: 200  mL/hr at 11/25/21 0825, 12.5 mg at 11/25/21 0825   TPN ADULT (ION), , Intravenous, Continuous TPN, Darrick Penna [facility]   Physical exam:  Vitals:   11/24/21 1945 11/25/21 0342 11/25/21 0739 11/25/21 1142  BP: (!) 147/79 137/72 128/73 134/80  Pulse: 94 100 94 89  Resp: 16 16 20 18   Temp: 98.6 F (37 C) 98 F (36.7 C) 97.7 F (36.5 C) 97.8 F (36.6 C)  TempSrc: Oral Oral Oral Oral  SpO2: 93% 95% 94% 97%  Weight:      Height:       Physical Exam Constitutional:      Appearance: He is ill-appearing.     Comments: Cachectic.   Cardiovascular:     Rate and Rhythm: Normal rate.  Pulmonary:     Effort: Pulmonary effort is normal.  Abdominal:     General: Abdomen is flat.     Palpations: Abdomen is soft.  Skin:    General: Skin is dry.  Neurological:     General: No focal deficit present.       CMP Latest Ref Rng & Units 11/25/2021  Glucose 70 - 99 mg/dL 131(H)  BUN 8 - 23 mg/dL 26(H)  Creatinine 0.61 - 1.24 mg/dL 1.07  Sodium 135 - 145 mmol/L 141  Potassium 3.5 - 5.1 mmol/L 3.1(L)  Chloride 98 - 111 mmol/L 99  CO2 22 - 32 mmol/L 26  Calcium 8.9 - 10.3 mg/dL 8.7(L)  Total Protein 6.5 - 8.1 g/dL 6.5  Total Bilirubin 0.3 - 1.2 mg/dL 1.4(H)  Alkaline Phos 38 - 126 U/L 79  AST 15 - 41 U/L 21  ALT 0 - 44 U/L 16   CBC Latest Ref Rng & Units 11/23/2021  WBC 4.0 - 10.5 K/uL 20.6(H)  Hemoglobin 13.0 - 17.0 g/dL 15.4  Hematocrit 39.0 - 52.0 % 45.7  Platelets 150 - 400 K/uL 154    RADIOGRAPHIC STUDIES: I have personally reviewed the radiological images as listed and agreed with the findings in the report. CT BIOPSY  Result Date: 11/24/2021 INDICATION: Pancreatic mass EXAM: CT BIOPSY MEDICATIONS: None. ANESTHESIA/SEDATION: Moderate (conscious) sedation was employed during this procedure. A total of Versed 1 mg and Fentanyl 12.5 mcg was administered intravenously. Moderate Sedation Time: 25 minutes. The patient's level of consciousness and vital signs were monitored  continuously by radiology nursing throughout the procedure under my direct supervision. FLUOROSCOPY TIME:  N/a COMPLICATIONS: None immediate. PROCEDURE: Informed written consent was obtained from the patient after a thorough discussion of the procedural risks, benefits and alternatives. All questions were addressed. Maximal Sterile Barrier Technique was utilized including caps, mask, sterile gowns, sterile gloves, sterile drape, hand hygiene and skin antiseptic. A timeout was performed prior to the initiation of the procedure. The patient was placed supine on the exam table. Limited CT of the abdomen was performed for planning purposes. This again demonstrated a large mass centered in the pancreatic tail. Skin entry site  was marked, and the overlying skin was prepped and draped in a standard sterile fashion. Local analgesia was obtained with 1% lidocaine. Using intermittent CT fluoroscopy, a 17 gauge introducer needle was advanced just deep to the pancreatic mass. Subsequently, core needle biopsy was performed using a 18 gauge core biopsy device x4 passes. Partially necrotic specimens were retrieved, and submitted in formalin to pathology for further review. All needles were removed and hemostasis was achieved at the skin using manual pressure. Limited postprocedure imaging demonstrated no complicating feature. A clean dressing was placed. The patient tolerated the procedure well without immediate complication. IMPRESSION: Successful CT-guided core needle biopsy of large pancreatic mass. Electronically Signed   By: Albin Felling M.D.   On: 11/24/2021 14:02   DG Abdomen Acute W/Chest  Result Date: 11/22/2021 CLINICAL DATA:  Vomiting EXAM: DG ABDOMEN ACUTE WITH 1 VIEW CHEST COMPARISON:  None. FINDINGS: There is no evidence of dilated bowel loops or free intraperitoneal air. No radiopaque calculi or other significant radiographic abnormality is seen. Heart size and mediastinal contours are within normal limits.  Both lungs are clear. IMPRESSION: Negative abdominal radiographs.  No acute cardiopulmonary disease. Electronically Signed   By: Ulyses Jarred M.D.   On: 11/22/2021 00:46   [place] EKG SITE RITE  Result Date: 11/25/2021 If Site Rite image not attached, placement could not be confirmed due to current cardiac rhythm.   Assessment and plan-   # Pancreatic mass with involvement of stomach and duodenum, partial obstruction. Intractable nausea vomiting. Pathology prelim results was discussed.  Likely aggressive malignancy, awaiting final pathology. Currently he is not a chemotherapy candidate due to malnutrition and cachexia. Agree with TPN, dietitian consultation.  Recommend consulting surgery for evaluation of feasibility of J-tube placement. If he is able to improve his nutrition status, we may be able to offer chemotherapy. Continue IV antiemetics, supportive care. Palliative care is on board.  I discussed above recommendations with primary team Dr. Ouida Sills. Thank you for allowing me to participate in the care of this patient.   Earlie Server, MD, PhD 11/25/2021

## 2021-11-26 DIAGNOSIS — R112 Nausea with vomiting, unspecified: Secondary | ICD-10-CM | POA: Diagnosis not present

## 2021-11-26 DIAGNOSIS — C251 Malignant neoplasm of body of pancreas: Secondary | ICD-10-CM | POA: Diagnosis not present

## 2021-11-26 DIAGNOSIS — Z66 Do not resuscitate: Secondary | ICD-10-CM | POA: Diagnosis not present

## 2021-11-26 DIAGNOSIS — Z515 Encounter for palliative care: Secondary | ICD-10-CM | POA: Diagnosis not present

## 2021-11-26 DIAGNOSIS — R64 Cachexia: Secondary | ICD-10-CM | POA: Diagnosis not present

## 2021-11-26 DIAGNOSIS — R1110 Vomiting, unspecified: Secondary | ICD-10-CM | POA: Diagnosis not present

## 2021-11-26 DIAGNOSIS — K920 Hematemesis: Secondary | ICD-10-CM | POA: Diagnosis not present

## 2021-11-26 DIAGNOSIS — E43 Unspecified severe protein-calorie malnutrition: Secondary | ICD-10-CM | POA: Diagnosis not present

## 2021-11-26 LAB — COMPREHENSIVE METABOLIC PANEL
ALT: 10 U/L (ref 0–44)
AST: 15 U/L (ref 15–41)
Albumin: 2.8 g/dL — ABNORMAL LOW (ref 3.5–5.0)
Alkaline Phosphatase: 67 U/L (ref 38–126)
Anion gap: 10 (ref 5–15)
BUN: 31 mg/dL — ABNORMAL HIGH (ref 8–23)
CO2: 29 mmol/L (ref 22–32)
Calcium: 8.3 mg/dL — ABNORMAL LOW (ref 8.9–10.3)
Chloride: 109 mmol/L (ref 98–111)
Creatinine, Ser: 1.04 mg/dL (ref 0.61–1.24)
GFR, Estimated: >60 mL/min (ref >60)
Glucose, Bld: 202 mg/dL — ABNORMAL HIGH (ref 70–99)
Potassium: 3.7 mmol/L (ref 3.5–5.1)
Sodium: 148 mmol/L — ABNORMAL HIGH (ref 135–145)
Total Bilirubin: 0.9 mg/dL (ref 0.3–1.2)
Total Protein: 5.8 g/dL — ABNORMAL LOW (ref 6.5–8.1)

## 2021-11-26 LAB — GLUCOSE, CAPILLARY
Glucose-Capillary: 183 mg/dL — ABNORMAL HIGH (ref 70–99)
Glucose-Capillary: 197 mg/dL — ABNORMAL HIGH (ref 70–99)

## 2021-11-26 LAB — TRIGLYCERIDES: Triglycerides: 122 mg/dL (ref <150)

## 2021-11-26 LAB — PHOSPHORUS: Phosphorus: 2.6 mg/dL (ref 2.5–4.6)

## 2021-11-26 LAB — SURGICAL PATHOLOGY

## 2021-11-26 LAB — MAGNESIUM: Magnesium: 2.3 mg/dL (ref 1.7–2.4)

## 2021-11-26 LAB — HEMOGLOBIN A1C
Hgb A1c MFr Bld: 5.9 % — ABNORMAL HIGH (ref 4.8–5.6)
Mean Plasma Glucose: 122.63 mg/dL

## 2021-11-26 MED ORDER — HALOPERIDOL LACTATE 5 MG/ML IJ SOLN
1.0000 mg | INTRAMUSCULAR | Status: DC | PRN
  Administered 2021-11-26: 1 mg via INTRAVENOUS
  Filled 2021-11-26: qty 1

## 2021-11-26 MED ORDER — TRAVASOL 10 % IV SOLN
INTRAVENOUS | Status: DC
  Filled 2021-11-26: qty 792

## 2021-11-26 MED ORDER — MORPHINE SULFATE (PF) 2 MG/ML IV SOLN
1.0000 mg | INTRAVENOUS | Status: DC | PRN
  Administered 2021-11-26: 1 mg via INTRAVENOUS
  Filled 2021-11-26: qty 1

## 2021-11-26 MED ORDER — SODIUM CHLORIDE 0.45 % IV SOLN: INTRAVENOUS | Status: DC

## 2021-11-26 MED ORDER — GLYCOPYRROLATE 1 MG PO TABS
1.0000 mg | ORAL_TABLET | ORAL | Status: DC | PRN
  Filled 2021-11-26: qty 1

## 2021-11-26 MED ORDER — GLYCOPYRROLATE 0.2 MG/ML IJ SOLN: 0.2000 mg | INTRAMUSCULAR | Status: DC | PRN

## 2021-11-26 MED ORDER — BIOTENE DRY MOUTH MT LIQD: 15.0000 mL | OROMUCOSAL | Status: DC | PRN

## 2021-11-26 MED ORDER — MORPHINE SULFATE (PF) 2 MG/ML IV SOLN: 1.0000 mg | INTRAVENOUS | Status: DC | PRN

## 2021-11-26 MED ORDER — INSULIN ASPART 100 UNIT/ML IJ SOLN
0.0000 [IU] | INTRAMUSCULAR | Status: DC
  Filled 2021-11-26: qty 1

## 2021-11-26 MED ORDER — GLYCOPYRROLATE 0.2 MG/ML IJ SOLN
0.2000 mg | INTRAMUSCULAR | Status: DC | PRN
  Administered 2021-11-26 (×3): 0.2 mg via INTRAVENOUS
  Filled 2021-11-26 (×3): qty 1

## 2021-11-26 MED ORDER — MORPHINE 100MG IN NS 100ML (1MG/ML) PREMIX INFUSION
1.0000 mg/h | INTRAVENOUS | Status: DC
  Administered 2021-11-26: 11:00:00 1 mg/h via INTRAVENOUS
  Administered 2021-11-26: 14:00:00 3 mg/h via INTRAVENOUS
  Administered 2021-11-26: 2 mg/h via INTRAVENOUS
  Filled 2021-11-26: qty 100

## 2021-11-26 MED ORDER — GLYCOPYRROLATE 0.2 MG/ML IJ SOLN
0.2000 mg | Freq: Once | INTRAMUSCULAR | Status: AC
  Administered 2021-11-26: 04:00:00 0.2 mg via INTRAVENOUS
  Filled 2021-11-26: qty 1

## 2021-11-26 MED ORDER — MORPHINE BOLUS VIA INFUSION
1.0000 mg | INTRAVENOUS | Status: DC | PRN
  Administered 2021-11-26 (×2): 1 mg via INTRAVENOUS
  Filled 2021-11-26: qty 1

## 2021-11-26 MED ORDER — POLYVINYL ALCOHOL 1.4 % OP SOLN
1.0000 [drp] | Freq: Four times a day (QID) | OPHTHALMIC | Status: DC | PRN
  Filled 2021-11-26: qty 15

## 2021-11-26 MED ORDER — ONDANSETRON HCL 4 MG/2ML IJ SOLN: 4.0000 mg | Freq: Four times a day (QID) | INTRAMUSCULAR | Status: DC | PRN

## 2021-11-26 NOTE — Progress Notes (Signed)
Nutrition Brief Note  Chart reviewed. Pt now transitioning to comfort care.  No further nutrition interventions planned at this time.  Please re-consult as needed.   Nakina Spatz W, RD, LDN, CDCES Registered Dietitian II Certified Diabetes Care and Education Specialist Please refer to AMION for RD and/or RD on-call/weekend/after hours pager   

## 2021-11-26 NOTE — Progress Notes (Signed)
Pt asked to swish water around mouth and spit out. Provided water to pt. Pt spouse concerned d/t NPO orders. Explained to spouse as long as pt was alert enough to "swish and spit" pt would be able to wet is mouth in this manner. Spouse satisfied. She verbalized she would be most comfortable with someone in the room when he did. I ensured her we would come bedside with pt when he wished to wet his mouth in this manner. Pt verbalized understanding of how to request assistance. Ensured spouse we will continue intentional rounding as well.

## 2021-11-26 NOTE — Progress Notes (Signed)
Family in the room at bedside today.  Patient lethargic and not able to have a conversation.  Family has chosen comfort care.  GI service will sign off at this time, please page with any questions or concerns

## 2021-11-26 NOTE — Progress Notes (Signed)
Chaplain Maggie offered support to pt's wife at bedside. Compassionate presence, listening, and prayer were central to this visit. A spiritual conversation about the role of our faith in the midst of suffering lead to considering the questions she is facing in the moment. It is difficult for her to sit alongside her husband's suffering. We spoke about the opportunity to learn something new about the couple's faith through the experience of his illness. We talked about the work of God through people who come alongside others. Acceptance of her husband's decline and the meaning of her being with him through his struggle is in process. Having someone to talk with was helpful. Continued support is available per on call chaplain.

## 2021-11-26 NOTE — Progress Notes (Signed)
Yellow mews protocol initiated. Continuing to monitor. Treatment of pain, nausea on-going throughout night see mar. See mar.    11/26/21 0451  Assess: MEWS Score  Temp 97.7 F (36.5 C)  BP 127/85  Pulse Rate (!) 117  Resp 17  SpO2 92 %  Assess: MEWS Score  MEWS Temp 0  MEWS Systolic 0  MEWS Pulse 2  MEWS RR 0  MEWS LOC 0  MEWS Score 2  MEWS Score Color Yellow  Assess: if the MEWS score is Yellow or Red  Were vital signs taken at a resting state? Yes  Focused Assessment No change from prior assessment  Does the patient meet 2 or more of the SIRS criteria? Yes  Does the patient have a confirmed or suspected source of infection? No  MEWS guidelines implemented *See Row Information* Yes  Treat  Pain Scale PAINAD  Breathing 2  Negative Vocalization 0  Facial Expression 0  Body Language 0  Consolability 0  PAINAD Score 2  Take Vital Signs  Increase Vital Sign Frequency  Yellow: Q 2hr X 2 then Q 4hr X 2, if remains yellow, continue Q 4hrs  Escalate  MEWS: Escalate Yellow: discuss with charge nurse/RN and consider discussing with provider and RRT  Notify: Charge Nurse/RN  Name of Charge Nurse/RN Notified Earlie Server RN  Date Charge Nurse/RN Notified 11/26/21  Time Charge Nurse/RN Notified [phone]  Document  Patient Outcome Other (Comment) (continuing to monitor)  Assess: SIRS CRITERIA  SIRS Temperature  0  SIRS Pulse 1  SIRS Respirations  0  SIRS WBC 0  SIRS Score Sum  1

## 2021-11-26 NOTE — Progress Notes (Addendum)
PROGRESS NOTE  Jeffrey Carr    DOB: 01-Dec-1950, 70 y.o.  YNW:[id number]  PCP: Juluis Pitch, MD   Code Status: DNR   DOA: 11/16/2021   LOS: 3  Brief Narrative of Current Hospitalization  Jeffrey Carr is a 70 y.o. male with a PMH significant for recently diagnosed pancreatic mass, HTN, CVA, sleep apnea, hypotestosteronemia, HLD.  Which was discovered by chart review in chart to be merged by the name of [name]. They presented from home to the ED on 11/21/2021 with hematemesis x 10 episodes.  He also had several months of unintended weight loss.  Was undergoing work-up outpatient for pancreatic mass 12/20.  In the ED, it was found that they had hemoglobin 16.5 and stable vital signs. They were treated with IV fluids, antiemetics and pain control.  GI was consulted for further evaluation and suspected Mallory-Weiss tears from vomiting. Heme/ONC consulted for further work-up of pancreatic mass as this is likely causing mass-effect and contributing to patient's presenting symptoms.  Patient was admitted to medicine service for further workup and management of intractable vomiting from pancreatic mass as outlined in detail below.  11/26/21 -stable, started on TPN 12/29  UPDATE: family and patient have decided to proceed with full comfort measures after meeting with palliative this morning. TPN will be discontinued.   Assessment & Plan  Principal Problem:   Intractable vomiting Active Problems:   Pancreatic mass   Pancreatic cancer (HCC)   Protein-calorie malnutrition, severe   Hematemesis with nausea   Intractable nausea and vomiting   Cachexia (HCC)  Intractable nausea, vomiting-  Severe calorie malnutrition-unable to tolerate PO at all. Has been several months of weight loss and decreased energy. Severe muscle wasting on exam. Generalized weakness.  Hypokalemia has resolved today. K+ 3.7 -GI following, appreciate recommendations -Antiemetics as needed -IV Protonix -Analgesia as  needed - continue IV hydration  - adding K+ repletion today - initiated TPN 12/29 after discussing with family and patient that this is intended to be a short term means of providing nutrition and is not a life-sustaining therapy. Family expresses understanding and agreement with this plan.   - appreciate monitoring by pharmacy  - monitor for refeeding syndrome  Pancreatic mass- tissue biopsy 12/28. Preliminary results suggest advanced malignancy.  -Oncology following, appreciate recommendations - disposition pending treatment plan from heme/onc -Palliative care following, appreciate recommendations -Nutritional supplementation  - analgesia PRN  HTN-moderately well controlled off home dose of lisinopril -Monitor and restart lisinopril as needed  HLD- -holding home atorvastatin when not tolerating p.o.  DVT prophylaxis: SCDs Start: 11/22/21 0251   Diet:  Diet Orders (From admission, onward)     Start     Ordered   11/24/21 0001  Diet NPO time specified Except for: Sips with Meds  Diet effective midnight       Question:  Except for  Answer:  Ferrel Logan with Meds   11/23/21 1635            Subjective 11/26/21    Pt unable to provide history this morning as he is sleeping.  Disposition Plan & Communication  Patient status: Inpatient  Admitted From: Home Disposition: TBD Anticipated discharge date: TBD  Family Communication: wife at bedside  Consults, Procedures, Significant Events  Consultants:  Oncology GI  Palliative General surgery  Procedures/significant events:  Pancreatic Bx 12/28  Antimicrobials:  Anti-infectives (From admission, onward)    None       Objective   Vitals:   11/26/21 0000 11/26/21 0451 11/26/21  0500 11/26/21 0927  BP: 122/78 127/85  107/75  Pulse: (!) 102 (!) 117  (!) 104  Resp: 17 17  16   Temp: 98.3 F (36.8 C) 97.7 F (36.5 C)  97.7 F (36.5 C)  TempSrc: Oral Oral  Oral  SpO2: 93% 92%  92%  Weight:   58.3 kg   Height:         Intake/Output Summary (Last 24 hours) at 11/26/2021 0956 Last data filed at 11/26/2021 0347 Gross per 24 hour  Intake 1521.01 ml  Output 220 ml  Net 1301.01 ml    Filed Weights   11/13/2021 2259 11/26/21 0500  Weight: 62 kg 58.3 kg    Patient BMI: Body mass index is 16.5 kg/m.   Physical Exam:  General: asleep, NAD, frail Respiratory: normal respiratory effort. Cardiovascular:  quick capillary refill r Extremities: decreased muscle tone Skin: dry, intact, normal temperature, normal color. No rashes, lesions or ulcers on exposed skin  Labs   I have personally reviewed following labs and imaging studies Admission on 11/01/2021  Component Date Value Ref Range Status   Lipase 11/20/2021 87 (H)  11 - 51 U/L Final   Sodium 11/03/2021 139  135 - 145 mmol/L Final   Potassium 11/20/2021 3.5  3.5 - 5.1 mmol/L Final   Chloride 11/02/2021 95 (L)  98 - 111 mmol/L Final   CO2 10/30/2021 29  22 - 32 mmol/L Final   Glucose, Bld 11/13/2021 137 (H)  70 - 99 mg/dL Final   BUN 11/14/2021 16  8 - 23 mg/dL Final   Creatinine, Ser 11/10/2021 1.24  0.61 - 1.24 mg/dL Final   Calcium 11/24/2021 9.5  8.9 - 10.3 mg/dL Final   Total Protein 10/31/2021 7.8  6.5 - 8.1 g/dL Final   Albumin 11/26/2021 3.9  3.5 - 5.0 g/dL Final   AST 11/06/2021 17  15 - 41 U/L Final   ALT 11/13/2021 17  0 - 44 U/L Final   Alkaline Phosphatase 11/24/2021 95  38 - 126 U/L Final   Total Bilirubin 11/17/2021 1.6 (H)  0.3 - 1.2 mg/dL Final   GFR, Estimated 11/10/2021 >60  >60 mL/min Final   Anion gap 11/02/2021 15  5 - 15 Final   WBC 11/05/2021 14.5 (H)  4.0 - 10.5 K/uL Final   RBC 11/09/2021 5.72  4.22 - 5.81 MIL/uL Final   Hemoglobin 11/18/2021 16.5  13.0 - 17.0 g/dL Final   HCT 10/31/2021 48.6  39.0 - 52.0 % Final   MCV 11/19/2021 85.0  80.0 - 100.0 fL Final   MCH 11/22/2021 28.8  26.0 - 34.0 pg Final   MCHC 11/09/2021 34.0  30.0 - 36.0 g/dL Final   RDW 11/24/2021 13.1  11.5 - 15.5 % Final   Platelets 11/20/2021  213  150 - 400 K/uL Final   nRBC 11/25/2021 0.0  0.0 - 0.2 % Final   Lactic Acid, Venous 10/30/2021 1.5  0.5 - 1.9 mmol/L Final   Troponin I (High Sensitivity) 11/11/2021 8  <18 ng/L Final   Blood Bank Specimen 11/15/2021 SAMPLE AVAILABLE FOR TESTING   Final   Sample Expiration 11/16/2021    Final                   Value:11/24/2021,2359 Performed at [facility], [address] [place], [place] [address]    Magnesium 11/15/2021 2.5 (H)  1.7 - 2.4 mg/dL Final   SARS Coronavirus 2 by RT PCR 10/28/2021 NEGATIVE  NEGATIVE Final  Influenza A by PCR 11/20/2021 NEGATIVE  NEGATIVE Final   Influenza B by PCR 11/15/2021 NEGATIVE  NEGATIVE Final   Sodium 11/22/2021 142  135 - 145 mmol/L Final   Potassium 11/22/2021 3.7  3.5 - 5.1 mmol/L Final   Chloride 11/22/2021 100  98 - 111 mmol/L Final   CO2 11/22/2021 26  22 - 32 mmol/L Final   Glucose, Bld 11/22/2021 131 (H)  70 - 99 mg/dL Final   BUN 11/22/2021 18  8 - 23 mg/dL Final   Creatinine, Ser 11/22/2021 1.11  0.61 - 1.24 mg/dL Final   Calcium 11/22/2021 8.9  8.9 - 10.3 mg/dL Final   GFR, Estimated 11/22/2021 >60  >60 mL/min Final   Anion gap 11/22/2021 16 (H)  5 - 15 Final   WBC 11/22/2021 14.9 (H)  4.0 - 10.5 K/uL Final   RBC 11/22/2021 5.26  4.22 - 5.81 MIL/uL Final   Hemoglobin 11/22/2021 15.2  13.0 - 17.0 g/dL Final   HCT 11/22/2021 44.4  39.0 - 52.0 % Final   MCV 11/22/2021 84.4  80.0 - 100.0 fL Final   MCH 11/22/2021 28.9  26.0 - 34.0 pg Final   MCHC 11/22/2021 34.2  30.0 - 36.0 g/dL Final   RDW 11/22/2021 13.0  11.5 - 15.5 % Final   Platelets 11/22/2021 199  150 - 400 K/uL Final   nRBC 11/22/2021 0.0  0.0 - 0.2 % Final   WBC 11/23/2021 20.6 (H)  4.0 - 10.5 K/uL Final   RBC 11/23/2021 5.37  4.22 - 5.81 MIL/uL Final   Hemoglobin 11/23/2021 15.4  13.0 - 17.0 g/dL Final   HCT 11/23/2021 45.7  39.0 - 52.0 % Final   MCV 11/23/2021 85.1  80.0 - 100.0 fL Final   MCH 11/23/2021 28.7  26.0 - 34.0 pg Final   MCHC 11/23/2021 33.7   30.0 - 36.0 g/dL Final   RDW 11/23/2021 13.2  11.5 - 15.5 % Final   Platelets 11/23/2021 154  150 - 400 K/uL Final   nRBC 11/23/2021 0.0  0.0 - 0.2 % Final   Sodium 11/23/2021 141  135 - 145 mmol/L Final   Potassium 11/23/2021 4.0  3.5 - 5.1 mmol/L Final   Chloride 11/23/2021 102  98 - 111 mmol/L Final   CO2 11/23/2021 26  22 - 32 mmol/L Final   Glucose, Bld 11/23/2021 104 (H)  70 - 99 mg/dL Final   BUN 11/23/2021 17  8 - 23 mg/dL Final   Creatinine, Ser 11/23/2021 0.94  0.61 - 1.24 mg/dL Final   Calcium 11/23/2021 8.7 (L)  8.9 - 10.3 mg/dL Final   GFR, Estimated 11/23/2021 >60  >60 mL/min Final   Anion gap 11/23/2021 13  5 - 15 Final   Prothrombin Time 11/23/2021 12.8  11.4 - 15.2 seconds Final   INR 11/23/2021 1.0  0.8 - 1.2 Final   Glucose-Capillary 11/25/2021 132 (H)  70 - 99 mg/dL Final   Sodium 11/25/2021 141  135 - 145 mmol/L Final   Potassium 11/25/2021 3.1 (L)  3.5 - 5.1 mmol/L Final   Chloride 11/25/2021 99  98 - 111 mmol/L Final   CO2 11/25/2021 26  22 - 32 mmol/L Final   Glucose, Bld 11/25/2021 131 (H)  70 - 99 mg/dL Final   BUN 11/25/2021 26 (H)  8 - 23 mg/dL Final   Creatinine, Ser 11/25/2021 1.07  0.61 - 1.24 mg/dL Final   Calcium 11/25/2021 8.7 (L)  8.9 - 10.3 mg/dL Final   Total  Protein 11/25/2021 6.5  6.5 - 8.1 g/dL Final   Albumin 11/25/2021 3.2 (L)  3.5 - 5.0 g/dL Final   AST 11/25/2021 21  15 - 41 U/L Final   ALT 11/25/2021 16  0 - 44 U/L Final   Alkaline Phosphatase 11/25/2021 79  38 - 126 U/L Final   Total Bilirubin 11/25/2021 1.4 (H)  0.3 - 1.2 mg/dL Final   GFR, Estimated 11/25/2021 >60  >60 mL/min Final   Anion gap 11/25/2021 16 (H)  5 - 15 Final   Phosphorus 11/25/2021 3.7  2.5 - 4.6 mg/dL Final   Magnesium 11/25/2021 2.2  1.7 - 2.4 mg/dL Final   Glucose-Capillary 11/25/2021 129 (H)  70 - 99 mg/dL Final   Glucose-Capillary 11/25/2021 116 (H)  70 - 99 mg/dL Final   Sodium 11/26/2021 148 (H)  135 - 145 mmol/L Final   Potassium 11/26/2021 3.7  3.5 - 5.1  mmol/L Final   Chloride 11/26/2021 109  98 - 111 mmol/L Final   CO2 11/26/2021 29  22 - 32 mmol/L Final   Glucose, Bld 11/26/2021 202 (H)  70 - 99 mg/dL Final   BUN 11/26/2021 31 (H)  8 - 23 mg/dL Final   Creatinine, Ser 11/26/2021 1.04  0.61 - 1.24 mg/dL Final   Calcium 11/26/2021 8.3 (L)  8.9 - 10.3 mg/dL Final   Total Protein 11/26/2021 5.8 (L)  6.5 - 8.1 g/dL Final   Albumin 11/26/2021 2.8 (L)  3.5 - 5.0 g/dL Final   AST 11/26/2021 15  15 - 41 U/L Final   ALT 11/26/2021 10  0 - 44 U/L Final   Alkaline Phosphatase 11/26/2021 67  38 - 126 U/L Final   Total Bilirubin 11/26/2021 0.9  0.3 - 1.2 mg/dL Final   GFR, Estimated 11/26/2021 >60  >60 mL/min Final   Anion gap 11/26/2021 10  5 - 15 Final   Magnesium 11/26/2021 2.3  1.7 - 2.4 mg/dL Final   Phosphorus 11/26/2021 2.6  2.5 - 4.6 mg/dL Final   Triglycerides 11/26/2021 122  <150 mg/dL Final   Glucose-Capillary 11/25/2021 154 (H)  70 - 99 mg/dL Final   Glucose-Capillary 11/26/2021 183 (H)  70 - 99 mg/dL Final   Glucose-Capillary 11/26/2021 197 (H)  70 - 99 mg/dL Final    Imaging Studies  CT BIOPSY  Result Date: 06-Dec-2021 INDICATION: Pancreatic mass EXAM: CT BIOPSY MEDICATIONS: None. ANESTHESIA/SEDATION: Moderate (conscious) sedation was employed during this procedure. A total of Versed 1 mg and Fentanyl 12.5 mcg was administered intravenously. Moderate Sedation Time: 25 minutes. The patient's level of consciousness and vital signs were monitored continuously by radiology nursing throughout the procedure under my direct supervision. FLUOROSCOPY TIME:  N/a COMPLICATIONS: None immediate. PROCEDURE: Informed written consent was obtained from the patient after a thorough discussion of the procedural risks, benefits and alternatives. All questions were addressed. Maximal Sterile Barrier Technique was utilized including caps, mask, sterile gowns, sterile gloves, sterile drape, hand hygiene and skin antiseptic. A timeout was performed prior to the  initiation of the procedure. The patient was placed supine on the exam table. Limited CT of the abdomen was performed for planning purposes. This again demonstrated a large mass centered in the pancreatic tail. Skin entry site was marked, and the overlying skin was prepped and draped in a standard sterile fashion. Local analgesia was obtained with 1% lidocaine. Using intermittent CT fluoroscopy, a 17 gauge introducer needle was advanced just deep to the pancreatic mass. Subsequently, core needle biopsy was performed using  a 18 gauge core biopsy device x4 passes. Partially necrotic specimens were retrieved, and submitted in formalin to pathology for further review. All needles were removed and hemostasis was achieved at the skin using manual pressure. Limited postprocedure imaging demonstrated no complicating feature. A clean dressing was placed. The patient tolerated the procedure well without immediate complication. IMPRESSION: Successful CT-guided core needle biopsy of large pancreatic mass. Electronically Signed   By: Albin Felling M.D.   On: 11/24/2021 14:02   DG Chest Port 1 View  Result Date: 11/25/2021 CLINICAL DATA:  Status post PICC line placement EXAM: PORTABLE CHEST 1 VIEW COMPARISON:  None. FINDINGS: Right arm PICC projects near the superior cavoatrial junction. Cardiac and mediastinal contours are within normal limits. Elevation of the left healing diaphragm. Mild left basilar opacity. No large pleural effusion or pneumothorax. IMPRESSION: 1. Right arm PICC projects near the superior cavoatrial junction. 2. Mild left basilar opacity, likely due to atelectasis. Electronically Signed   By: Yetta Glassman M.D.   On: 11/25/2021 16:05   [place] EKG SITE RITE  Result Date: 11/25/2021 If Site Rite image not attached, placement could not be confirmed due to current cardiac rhythm.   Medications   Scheduled Meds:  Chlorhexidine Gluconate Cloth  6 each Topical Daily   insulin aspart  0-9 Units  Subcutaneous Q4H   ondansetron  4 mg Oral Q6H   Or   ondansetron (ZOFRAN) IV  4 mg Intravenous Q6H   pantoprazole (PROTONIX) IV  40 mg Intravenous Q12H   sodium chloride flush  10-40 mL Intracatheter Q12H   No recently discontinued medications to reconcile  LOS: 3 days   Richarda Osmond, DO [facility] 11/26/2021, 9:56 AM   Available by Epic secure chat 7AM-7PM. If 7PM-7AM, please contact night-coverage Refer to [email or web address] to contact the [facility] Attending or Consulting provider for this pt

## 2021-11-26 NOTE — Progress Notes (Deleted)
Yellow mews protocol initiated

## 2021-11-26 NOTE — TOC Progression Note (Signed)
Transition of Care [facility]) - Progression Note    Patient Details  Name: Jeffrey Carr MRN: [id number] Date of Birth: [birth year]  [facility] CM/SW Contact  Pete Pelt, RN Phone Number: 11/26/2021, 1:05 PM  Clinical Narrative:  Patient has been changed to comfort care by team     Expected Discharge Plan:  (TBD) Barriers to Discharge: Continued Medical Work up  Expected Discharge Plan and Services Expected Discharge Plan:  (TBD) In-house Referral: Chaplain Discharge Planning Services: CM Consult   Living arrangements for the past 2 months: Single Family Home                                       Social Determinants of Health (SDOH) Interventions    Readmission Risk Interventions No flowsheet data found.

## 2021-11-26 NOTE — Care Management Important Message (Signed)
Important Message  Patient Details  Name: Jeffrey Carr MRN: [id number] Date of Birth: 1950-11-29   Medicare Important Message Given:  N/A - LOS <3 / Initial given by admissions     Juliann Pulse A Abie Killian 11/26/2021, 8:54 AM

## 2021-11-26 NOTE — Progress Notes (Addendum)
Palliative Care Progress Note, Assessment & Plan   Patient Name: Jeffrey Carr       Date: 11/26/2021 DOB: 05/19/1951  Age: 70 y.o. MRN#: [id number] Attending Physician: Richarda Osmond, MD Primary Care Physician: Juluis Pitch, MD Admit Date: 11/05/2021  Reason for Consultation/Follow-up: Establishing goals of care  Subjective: Patient is lying in bed in mild distress.  He has mild increased work of breathing.  His eyes are open but he has no response to track or change in pupil size with my assessment.  He appears to be sleeping with his eyes open.  Wife is at bedside.   HPI: Patient is a 70 year old male with a past medical history significant for HTN, CVA, sleep apnea, hypotestosteronemia, HLD, and recently diagnosed with a pancreatic mass.     He presented to the emergency department on 12/25 with hematemesis x10 episodes.  Patient's wife is historian.  She endorses several months of unintended weight loss - approximately 40 pounds. Patient underwent CT-guided biopsy of pancreatic mass on 12/28.  Preliminary results of the biopsy reflect high-grade malignancy of the pancreas. Patient continues to have intractable nausea with vomiting.   Palliative medicine team was consulted to discuss goals of care.  Summary of counseling/coordination of care: After reviewing the patient's chart, I assessed patient at bedside.  Discussed with wife the patient has reached the end of his life and appears to be actively dying.  She shared she does not want him to suffer and that she thinks he is tired.  I shared that we can liberate him from machines, medical interventions, labs, vital sign checks, blood glucose monitoring, and watching the numbers.  Instead, we can focus on giving him compassionate, dignified care  that focuses on aggressively managing his symptoms.  I shared we can keep him comfortable and allow the best quality of life for him for long as left.  Wife was appropriately tearful.  She was in agreement to ensure the patient does not suffer.  She is in full agreement to move forward with full comfort measures.  I advised her to call whenever family she thinks would like to say their final goodbyes to the patient.  Also shared we be much more aggressive with his pain and nausea medication with the understanding that this may make him more sedated and he may sleep more throughout the day.  I shared with her I believe he has hours to days to live.  She confirmed understanding.  Therapeutic silence and active listening provided for wife to share her thoughts and emotions regarding her husband he reaches the end of his life.  She recalls the story of their first date and the day of their wedding.  I notified nursing that patient has been moved to full comfort.  I asked that a dose of morphine and Haldol be given. It iss not quite time for Haldol but morphine was given with me present.    I remained in the room and saw no huge difference in the patient's increased work of breathing or relief from pain.  Therefore, I converted patient's as needed morphine to a morphine GTT.  Nursing advised that if more than 2 boluses are given in  1 hour than basal rate of morphine drip should be increased.  I discussed with wife that patient could be a candidate for hospice inpatient unit.  However, I would like to ensure the patient's pain and nausea is better controlled, see how he does, and reevaluate appropriateness of moving him at a later time.  Patient's and concerns were addressed.  Patient's wife declined spiritual care at this time.  Palliative medicine team will continue to follow patient throughout his hospitalization.  Afternoon update:  I rounded on the patient this afternoon.  He appears to be much  more comfortable with not as much work of breathing.  Wife shared she would like to have him considered for hospice inpatient unit placement.  I conveyed my concerns that transport may be more traumatizing and painful for him.  He may also pass away in transport.   I recommended that the patient stay in the hospital as I anticipate him passing here. I shared the medical team would monitor him through the night and should he survive the night and become stable enough then we would have him evaluated for hospice inpatient placement.  Wife was in agreement and repeatedly stated she just wants "what is best for him "and for him "not to suffer".    Dr. Ouida Sills, [facility] social worker, and hospice liaison made aware.    As per Loren Racer, Hospice Liaison, should wife request hospice evaluation over the weekend please have TOC or family call [phone] or [phone].  Code Status: DNR  Prognosis: Hours - Days  Discharge Planning: Anticipated Hospital Death  Recommendations/Plan: DNR Full comfort measures Unrestricted visitor access Morphine GTT Aggressive nausea and vomiting measures-Zofran, Phenergan, Haldol Robinul for secretions Discontinue all medications and interventions not focused on comfort (TPN, IVF, supplemental oxygen)  Care plan was discussed with patient, patient's wife, nurse Debi, Dr. Ouida Sills  Physical Exam Constitutional:      General: He is in acute distress.     Appearance: He is ill-appearing and toxic-appearing.  HENT:     Head: Normocephalic and atraumatic.     Mouth/Throat:     Mouth: Mucous membranes are dry.     Comments: Drooping of nasolabial folds Eyes:     Comments: Not tracking, inability to keep eyelids closed  Cardiovascular:     Rate and Rhythm: Normal rate.     Pulses: Normal pulses.  Pulmonary:     Breath sounds: Rhonchi present.  Abdominal:     Palpations: Abdomen is soft.  Musculoskeletal:     Comments: Generalized weakness   Neurological:     Comments: nonverbal               Total Time 120 minutes  Greater than 50%  of this time was spent counseling and coordinating care related to the above assessment and plan.  Thank you for allowing the Palliative Medicine Team to assist in the care of this patient.  Hanamaulu Ilsa Iha, FNP-BC Palliative Medicine Team Team Phone # [phone]

## 2021-11-27 DIAGNOSIS — R1110 Vomiting, unspecified: Secondary | ICD-10-CM | POA: Diagnosis not present

## 2021-11-28 NOTE — Progress Notes (Signed)
Called to provide care for family after death of patient. Met with a grieving family, provided presence and prayer.

## 2021-11-28 DEATH — deceased

## 2021-11-30 ENCOUNTER — Encounter: Payer: Self-pay | Admitting: Oncology

## 2021-12-02 ENCOUNTER — Encounter: Admission: RE | Payer: Self-pay | Source: Home / Self Care

## 2021-12-02 ENCOUNTER — Ambulatory Visit: Admission: RE | Admit: 2021-12-02 | Payer: PPO | Source: Home / Self Care | Admitting: Internal Medicine

## 2021-12-02 SURGERY — UPPER ENDOSCOPIC ULTRASOUND (EUS) LINEAR
Anesthesia: General

## 2021-12-29 NOTE — Death Summary Note (Signed)
DEATH SUMMARY   Patient Details  Name: Jeffrey Carr MRN: [id number] DOB: [birth year] [id number], Jeffrey Brow, MD  Admission/Discharge Information   Admit Date:  23-Nov-2021  Date of Death: Date of Death: 2021-11-29  Time of Death: Time of Death: Feb 12, 1109  Length of Stay: 4   Principle Cause of death: UNDIFFERENTIATED METASTATIC PANCREATIC CARCINOMA WITH ABUNDANT TUMOR NECROSIS.   Hospital Diagnoses: Principal Problem:   Intractable vomiting Active Problems:   Pancreatic mass   Pancreatic cancer (HCC)   Protein-calorie malnutrition, severe   Hematemesis with nausea   Intractable nausea and vomiting   Cachexia [facility]   Hospital Course: Jeffrey Carr is a 71 y.o. male with medical history significant for Recently diagnosed pancreatic mass, seen by oncology on 12/23 with plans for biopsy to establish tissue diagnosis, who presents to the ED for evaluation of vomiting. It was initially nonbloody and nonbilious but then appeared to have some blood. He had about 10 episodes prior to admission.  This is associated with abdominal pain when vomiting. GI was consulted for hematemesis, patient was started on PPI twice daily.  Oncology as well as palliative care were also consulted for goals of care conversation. Underwent CT-guided biopsy of the pancreatic mass on 12/28. CODE STATUS changed to DNR on 12/29.  TPN was started on the same day as well.  Due to mass-effect from tumor in the stomach as well as pancreatic the area general surgery was consulted on that day as well for consideration of feeding tube placement.  Patient had a progressive decline in his mental status. On 12/30 patient has progressive decline and has mental status with worsening respiratory distress.  Patient and family prefer to transition to complete comfort.  Morphine bolus was given without any improvement and therefore patient was started on morphine infusion for symptom control for pain and respiratory distress. Patient  passed away on Nov 29, 2021 at 11:10 AM.   Problems addressed during this hospital stay.  Undifferentiated metastatic pancreatic cancer Intractable nausea and vomiting secondary to gastric outlet obstruction from pancreatic mass. GI consult surgery, oncology consulted.  IR guided biopsy performed.  Severe protein calorie malnutrition. Unintentional weight loss of 40 pounds. Underweight, cancer related cachexia Body mass index is 17.55 kg/m.  Dietitian consulted.  TPN was provided.  Hypokalemia.  Replaced.  Abdominal pain secondary to malignancy. Treated with IV pain medication.  Essential hypertension. Blood pressure was stable.  Hyperlipidemia. Patient was on statin which was on hold secondary to poor p.o. intake.  History of CVA. No focal deficit.  Sleep apnea. History, does not use CPAP.  Hematemesis secondary to Mallory-Weiss tear. H&H remained stable after initial stabilization.  No vomiting of blood as well.  Treated conservatively.  GI was consulted.  Hypernatremia Treated with IV fluid.  Leukocytosis secondary to stress reaction.  History of intracranial bleeding secondary to cerebral AVM SP embolization  Procedures: PICC line placement. CT-guided IR biopsy of pancreatic mass.  Consultations:  GI General surgery Hematology oncology Palliative care Interventional radiology  The results of significant diagnostics from this hospitalization (including imaging, microbiology, ancillary and laboratory) are listed below for reference.   Significant Diagnostic Studies: MR ABDOMEN WWO CONTRAST  Result Date: 11/17/2021 CLINICAL DATA:  Weight loss, pancreatic mass, indeterminate liver lesions EXAM: MRI ABDOMEN WITHOUT AND WITH CONTRAST TECHNIQUE: Multiplanar multisequence MR imaging of the abdomen was performed both before and after the administration of intravenous contrast. CONTRAST:  7.47mL GADAVIST GADOBUTROL 1 MMOL/ML IV SOLN COMPARISON:  CT chest abdomen  pelvis, 11/16/2021 FINDINGS:  Lower chest: No acute findings. Hepatobiliary: There are multiple subcentimeter, fluid signal, nonenhancing lesions of the left and right lobes of the liver, consistent with subcentimeter simple cysts, for example in the left lobe, hepatic segment II (series 4, image 8) and in the posterior liver dome, hepatic segment VII (series 4, image 10). No solid mass or other parenchymal abnormality identified. No gallstones. No biliary ductal dilatation. Pancreas: Redemonstrated large, hypoenhancing mass of the pancreatic body and tail, measuring 5.3 x 5.3 cm (series 19, image 39). This appears to directly involve the adjacent greater curvature of the stomach (series 19, image 26) as well as the duodenal jejunal junction (series 3, image 15, series 4, image 19). This mass appears to efface the underlying splenic vein and encase the mid splenic artery. Dilatation of the pancreatic duct in the tip of the pancreatic tail. Spleen:  Within normal limits in size and appearance. Adrenals/Urinary Tract: No masses identified. Numerous bilateral parapelvic cysts. No hydronephrosis. No evidence of hydronephrosis. Stomach/Bowel: Visualized portions within the abdomen are unremarkable. Vascular/Lymphatic: No pathologically enlarged lymph nodes identified. No abdominal aortic aneurysm demonstrated. Other:  None. Musculoskeletal: No suspicious bone lesions identified. IMPRESSION: 1. Redemonstrated large, hypoenhancing mass of the pancreatic body and tail, measuring 5.3 x 5.3 cm and consistent with pancreatic adenocarcinoma. 2. This appears to directly involve the adjacent greater curvature of the stomach as well as the duodenal jejunal junction, and appears to efface the underlying splenic vein and encase the mid splenic artery. 3. Multiple subcentimeter liver lesions identified by prior CT are simple cysts by MR. No suspicious liver lesions. 4. No evidence of lymphadenopathy or metastatic disease within the  abdomen. 5. Multiple subcentimeter simple hepatic cysts. Electronically Signed   By: Delanna Ahmadi M.D.   On: 11/17/2021 13:09   CT CHEST ABDOMEN PELVIS W CONTRAST  Result Date: 11/16/2021 CLINICAL DATA:  60 pound weight loss.  Poor historian. EXAM: CT CHEST, ABDOMEN, AND PELVIS WITH CONTRAST TECHNIQUE: Multidetector CT imaging of the chest, abdomen and pelvis was performed following the standard protocol during bolus administration of intravenous contrast. CONTRAST:  63mL OMNIPAQUE IOHEXOL 300 MG/ML  SOLN COMPARISON:  None. FINDINGS: CT CHEST FINDINGS Cardiovascular: Aortic atherosclerosis. Normal heart size, without pericardial effusion. Lad and right coronary artery calcification. No central pulmonary embolism, on this non-dedicated study. Mediastinum/Nodes: No supraclavicular adenopathy. No mediastinal or hilar adenopathy. Lungs/Pleura: No pleural fluid. Left base scarring. Lower lung predominant subtle micronodularity is likely post infectious or inflammatory. Example in the left lower lobe on 123/3. No typical findings of pulmonary metastasis. Musculoskeletal: No acute osseous abnormality. Remote left twelfth rib fracture. CT ABDOMEN PELVIS FINDINGS Hepatobiliary: Scattered hypoattenuating liver lesions are too small to characterize. Normal gallbladder, without biliary ductal dilatation. Pancreas: Pancreatic body/tail junction heterogeneous mass is most consistent with adenocarcinoma. Example at 5.4 x 5.3 cm on 70/2. 6.8 cm on coronal image 54. Upstream atrophy and duct dilatation are relatively mild. No superimposed acute pancreatitis. Spleen: Normal in size, without focal abnormality. Adrenals/Urinary Tract: Normal adrenal glands. Bilateral renal sinus cysts, without hydronephrosis. Left renal cortical subcentimeter cyst. Normal urinary bladder. Stomach/Bowel: The stomach is mildly distended and fluid-filled. The duodenum is mild to moderately dilated to the level of the pancreatic mass, where there is  probable involvement of the duodenal/jejunal junction, including on 76/2 and coronal image 60. More distal small bowel is unremarkable. Normal colon and terminal ileum. Vascular/Lymphatic: Aortic atherosclerosis. Involvement of splenic artery and vein by tumor. Resultant gastroepiploic collaterals. The SMV is uninvolved. The splenoportal  confluence may be contacted by tumor on 73/2. No abdominopelvic adenopathy. Reproductive: Normal prostate. Other: Right inguinal hernia repair. No significant free fluid. No evidence of omental or peritoneal disease. Musculoskeletal: Degenerative partial fusion of bilateral sacroiliac joints. IMPRESSION: 1. Pancreatic body/tail carcinoma with involvement of the duodenal jejunal junction. This causes partial obstruction of the stomach and proximal duodenum. 2. Too small to characterize liver lesions. If the patient is otherwise considered a surgical candidate, consider further evaluation with pre and post contrast abdominal MRI. 3. Vascular involvement by tumor as detailed above. 4.  No acute process or evidence of metastatic disease in the chest. 5. Coronary artery atherosclerosis. Aortic Atherosclerosis (ICD10-I70.0). These results will be called to the ordering clinician or representative by the Radiology Department at the imaging location. Electronically Signed   By: Abigail Miyamoto M.D.   On: 11/16/2021 12:07   CT BIOPSY  Result Date: 11/24/2021 INDICATION: Pancreatic mass EXAM: CT BIOPSY MEDICATIONS: None. ANESTHESIA/SEDATION: Moderate (conscious) sedation was employed during this procedure. A total of Versed 1 mg and Fentanyl 12.5 mcg was administered intravenously. Moderate Sedation Time: 25 minutes. The patient's level of consciousness and vital signs were monitored continuously by radiology nursing throughout the procedure under my direct supervision. FLUOROSCOPY TIME:  N/a COMPLICATIONS: None immediate. PROCEDURE: Informed written consent was obtained from the patient  after a thorough discussion of the procedural risks, benefits and alternatives. All questions were addressed. Maximal Sterile Barrier Technique was utilized including caps, mask, sterile gowns, sterile gloves, sterile drape, hand hygiene and skin antiseptic. A timeout was performed prior to the initiation of the procedure. The patient was placed supine on the exam table. Limited CT of the abdomen was performed for planning purposes. This again demonstrated a large mass centered in the pancreatic tail. Skin entry site was marked, and the overlying skin was prepped and draped in a standard sterile fashion. Local analgesia was obtained with 1% lidocaine. Using intermittent CT fluoroscopy, a 17 gauge introducer needle was advanced just deep to the pancreatic mass. Subsequently, core needle biopsy was performed using a 18 gauge core biopsy device x4 passes. Partially necrotic specimens were retrieved, and submitted in formalin to pathology for further review. All needles were removed and hemostasis was achieved at the skin using manual pressure. Limited postprocedure imaging demonstrated no complicating feature. A clean dressing was placed. The patient tolerated the procedure well without immediate complication. IMPRESSION: Successful CT-guided core needle biopsy of large pancreatic mass. Electronically Signed   By: Albin Felling M.D.   On: 11/24/2021 14:02   DG Chest Port 1 View  Result Date: 11/25/2021 CLINICAL DATA:  Status post PICC line placement EXAM: PORTABLE CHEST 1 VIEW COMPARISON:  None. FINDINGS: Right arm PICC projects near the superior cavoatrial junction. Cardiac and mediastinal contours are within normal limits. Elevation of the left healing diaphragm. Mild left basilar opacity. No large pleural effusion or pneumothorax. IMPRESSION: 1. Right arm PICC projects near the superior cavoatrial junction. 2. Mild left basilar opacity, likely due to atelectasis. Electronically Signed   By: Yetta Glassman  M.D.   On: 11/25/2021 16:05   DG Abdomen Acute W/Chest  Result Date: 11/22/2021 CLINICAL DATA:  Vomiting EXAM: DG ABDOMEN ACUTE WITH 1 VIEW CHEST COMPARISON:  None. FINDINGS: There is no evidence of dilated bowel loops or free intraperitoneal air. No radiopaque calculi or other significant radiographic abnormality is seen. Heart size and mediastinal contours are within normal limits. Both lungs are clear. IMPRESSION: Negative abdominal radiographs.  No acute cardiopulmonary disease.  Electronically Signed   By: Ulyses Jarred M.D.   On: 11/22/2021 00:46   [place] EKG SITE RITE  Result Date: 11/25/2021 If Site Rite image not attached, placement could not be confirmed due to current cardiac rhythm.   Microbiology: Recent Results (from the past 240 hour(s))  Resp Panel by RT-PCR (Flu A&B, Covid) Nasopharyngeal Swab     Status: None   Collection Time: 11/25/2021 11:04 PM   Specimen: Nasopharyngeal Swab; Nasopharyngeal(NP) swabs in vial transport medium  Result Value Ref Range Status   SARS Coronavirus 2 by RT PCR NEGATIVE NEGATIVE Final    Comment: (NOTE) SARS-CoV-2 target nucleic acids are NOT DETECTED.  The SARS-CoV-2 RNA is generally detectable in upper respiratory specimens during the acute phase of infection. The lowest concentration of SARS-CoV-2 viral copies this assay can detect is 138 copies/mL. A negative result does not preclude SARS-Cov-2 infection and should not be used as the sole basis for treatment or other patient management decisions. A negative result may occur with  improper specimen collection/handling, submission of specimen other than nasopharyngeal swab, presence of viral mutation(s) within the areas targeted by this assay, and inadequate number of viral copies(<138 copies/mL). A negative result must be combined with clinical observations, patient history, and epidemiological information. The expected result is Negative.  Fact Sheet for Patients:   EntrepreneurPulse.com.au  Fact Sheet for Healthcare Providers:  IncredibleEmployment.be  This test is no t yet approved or cleared by the [place] FDA and  has been authorized for detection and/or diagnosis of SARS-CoV-2 by FDA under an Emergency Use Authorization (EUA). This EUA will remain  in effect (meaning this test can be used) for the duration of the COVID-19 declaration under Section 564(b)(1) of the Act, 21 U.S.C.section 360bbb-3(b)(1), unless the authorization is terminated  or revoked sooner.       Influenza A by PCR NEGATIVE NEGATIVE Final   Influenza B by PCR NEGATIVE NEGATIVE Final    Comment: (NOTE) The Xpert Xpress SARS-CoV-2/FLU/RSV plus assay is intended as an aid in the diagnosis of influenza from Nasopharyngeal swab specimens and should not be used as a sole basis for treatment. Nasal washings and aspirates are unacceptable for Xpert Xpress SARS-CoV-2/FLU/RSV testing.  Fact Sheet for Patients: EntrepreneurPulse.com.au  Fact Sheet for Healthcare Providers: IncredibleEmployment.be  This test is not yet approved or cleared by the [place] FDA and has been authorized for detection and/or diagnosis of SARS-CoV-2 by FDA under an Emergency Use Authorization (EUA). This EUA will remain in effect (meaning this test can be used) for the duration of the COVID-19 declaration under Section 564(b)(1) of the Act, 21 U.S.C. section 360bbb-3(b)(1), unless the authorization is terminated or revoked.  Performed at [facility], [address] [place], [place] [address]     Time spent: 35 minutes  Signed: Berle Mull 2021-12-23

## 2022-04-20 IMAGING — MR MR ABDOMEN WO/W CM
20 of 22 series · 44 of 48 positions shown · IV contrast (7.5 ml Gadavist)
Comparison: CT chest abdomen pelvis, 11/16/2021

CLINICAL DATA: Weight loss, pancreatic mass, indeterminate liver
lesions

EXAM:
MRI ABDOMEN WITHOUT AND WITH CONTRAST
TECHNIQUE: Multiplanar multisequence MR imaging of the abdomen was performed
both before and after the administration of intravenous contrast.
CONTRAST:  7.5mL GADAVIST GADOBUTROL 1 MMOL/ML IV SOLN

[Series 3: T2 · coronal · 6.0mm · 1.06mm/px · 1 of 25 slices shown (1 of 2)]
[im 1/25]
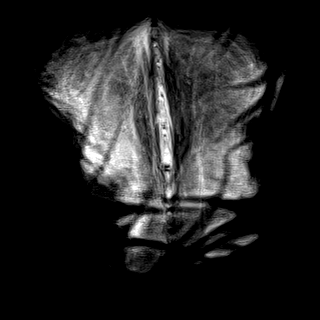

[Series 4: T2 · axial · 6.0mm · 1.00mm/px · 1 of 32 slices shown (2 of 2)]
[im 1/32]
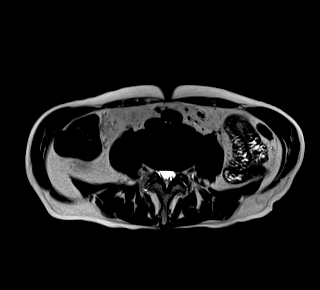

[Series 5: T1 · axial · 3.0mm · 1.00mm/px · z∈[-195,+42]mm · 2 of 80 slices shown (1 of 2)]
[im 1/80]
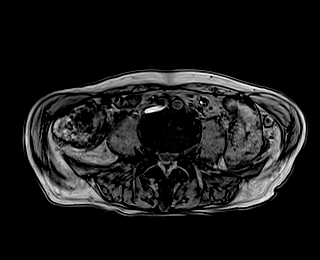
[im 80/80]
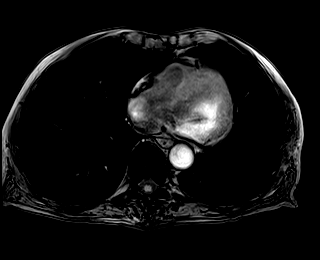

[Series 6: T1 · axial · 3.0mm · 1.00mm/px · z∈[-195,+42]mm · 3 of 80 slices shown (2 of 2)]
[im 1/80]
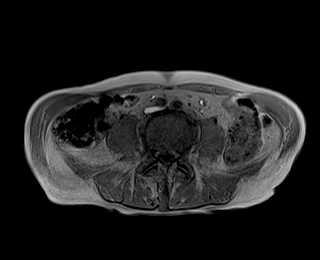
[im 40/80]
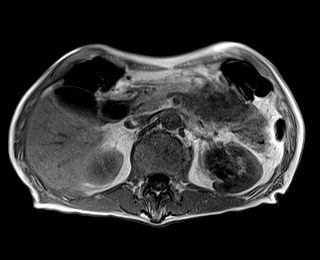
[im 80/80]
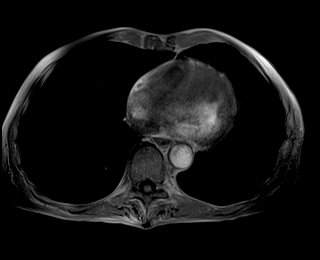

[Series 9: T2 fat-sat · axial · 6.0mm · 1.03mm/px · 1 of 34 slices shown]
[im 1/34]
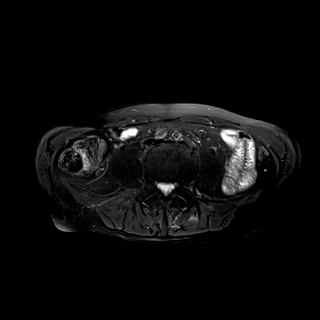

[Series 13: ax dwi_tracew · axial · 6.0mm · 1.42mm/px · z∈[-195,+42]mm · 3 of 102 slices shown]
[im 1/102]
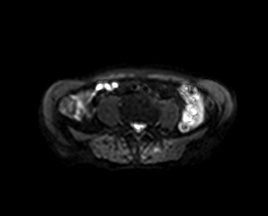
[im 51/102]
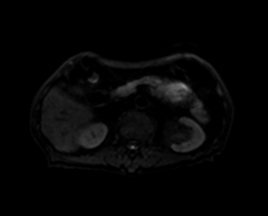
[im 102/102]
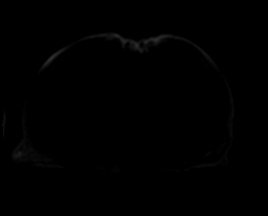

[Series 14: ax dwi_adc · axial · 6.0mm · 1.42mm/px · 1 of 34 slices shown]
[im 1/34]
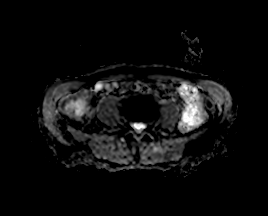

[Series 15: MRCP · coronal · 3.0mm · 1.12mm/px · 1 of 17 slices shown]
[im 1/17]
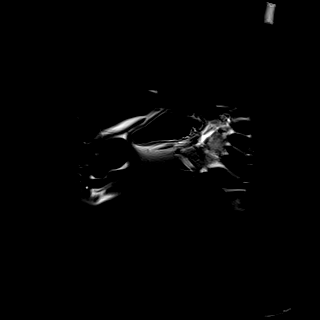

[Series 16: bSSFP · axial · 6.0mm · 0.66mm/px · 1 of 32 slices shown]
[im 1/32]
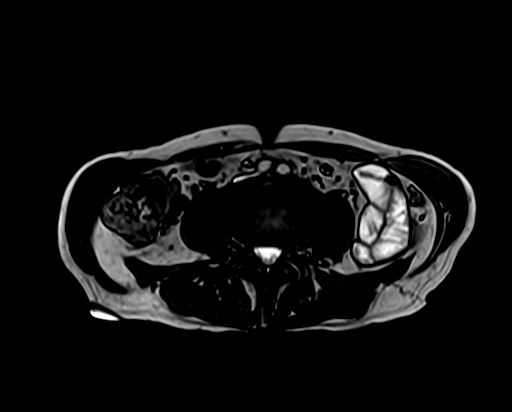

[Series 17: radials · coronal · 50.0mm · 0.78mm/px · 1 of 5 slices shown]
[im 1/5]
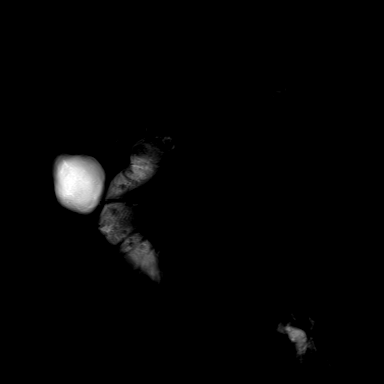

[Series 18: T1 dynamic fat-sat · axial · non-contrast · 3.0mm · 1.00mm/px · z∈[-207,+54]mm · 3 of 88 slices shown (1 of 5)]
[im 1/88]
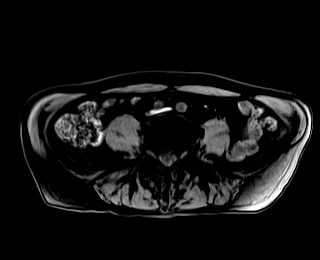
[im 44/88]
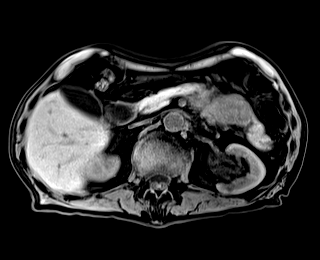
[im 88/88]
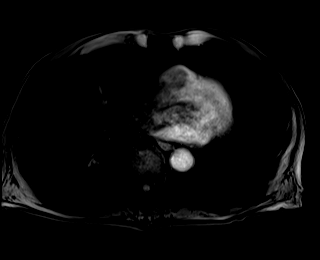

[Series 19: T1 dynamic fat-sat post-contrast · axial · 3.0mm · 1.00mm/px · z∈[-207,+54]mm · 3 of 88 slices shown (1 of 4)]
[im 1/88]
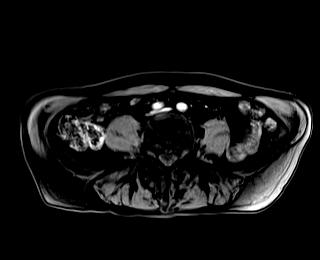
[im 44/88]
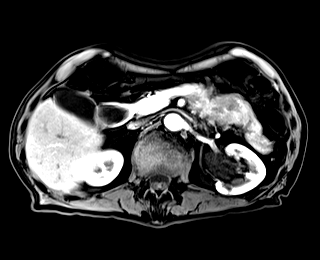
[im 88/88]
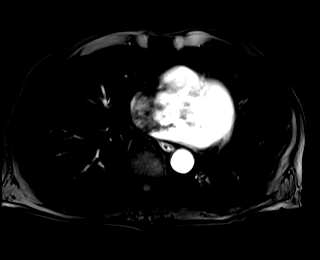

[Series 20: T1 dynamic fat-sat · axial · 3.0mm · 1.00mm/px · z∈[-207,+54]mm · 3 of 88 slices shown (2 of 5)]
[im 1/88]
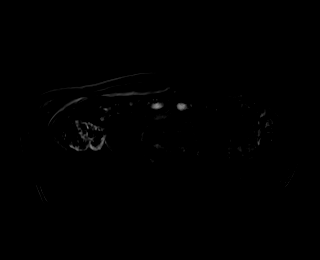
[im 44/88]
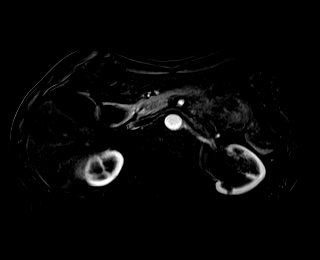
[im 88/88]
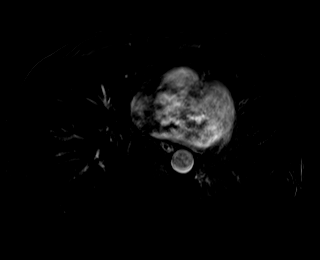

[Series 21: T1 dynamic fat-sat post-contrast · axial · 3.0mm · 1.00mm/px · z∈[-207,+54]mm · 3 of 88 slices shown (2 of 4)]
[im 1/88]
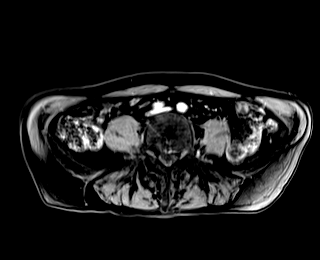
[im 44/88]
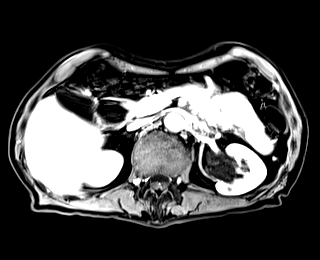
[im 88/88]
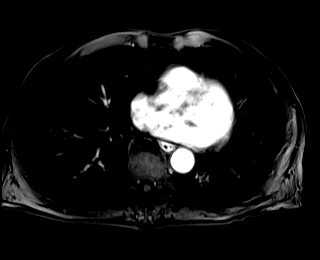

[Series 22: T1 dynamic fat-sat · axial · 3.0mm · 1.00mm/px · z∈[-207,+54]mm · 3 of 88 slices shown (3 of 5)]
[im 1/88]
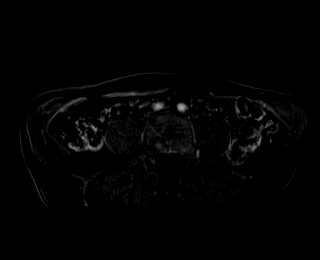
[im 44/88]
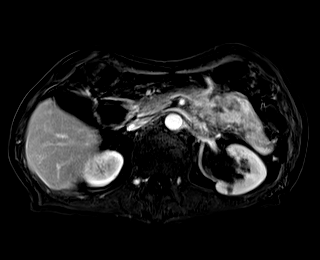
[im 88/88]
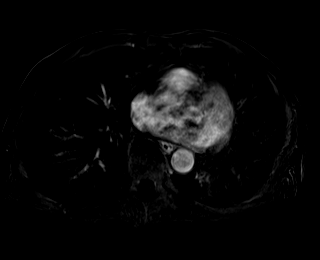

[Series 23: T1 dynamic fat-sat post-contrast · axial · 3.0mm · 1.00mm/px · z∈[-207,+54]mm · 3 of 88 slices shown (3 of 4)]
[im 1/88]
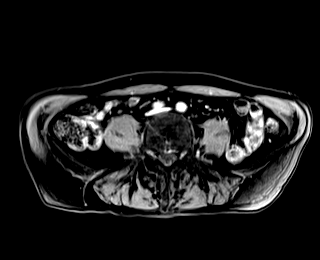
[im 44/88]
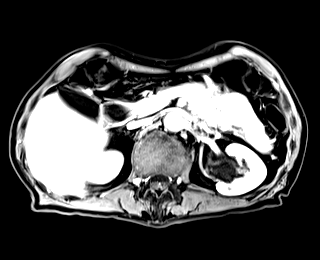
[im 88/88]
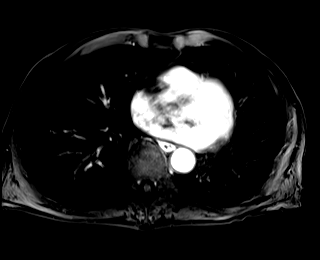

[Series 24: T1 dynamic fat-sat · axial · 3.0mm · 1.00mm/px · z∈[-207,+54]mm · 3 of 88 slices shown (4 of 5)]
[im 1/88]
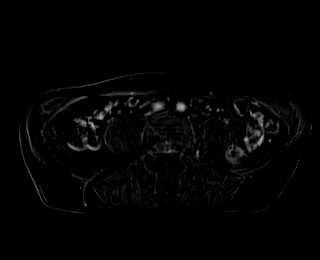
[im 44/88]
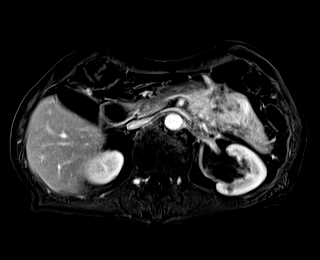
[im 88/88]
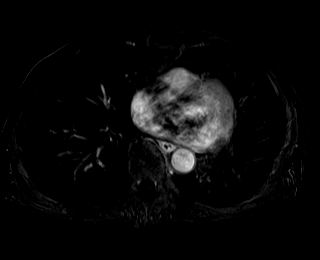

[Series 25: T1 dynamic post-contrast · coronal · 3.0mm · 1.31mm/px · 2 of 72 slices shown]
[im 1/72]
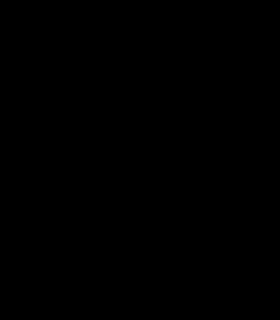
[im 72/72]
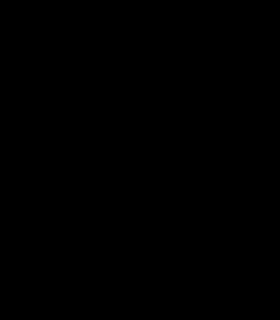

[Series 26: T1 dynamic fat-sat post-contrast · axial · 3.0mm · 1.00mm/px · z∈[-207,+54]mm · 3 of 88 slices shown (4 of 4)]
[im 1/88]
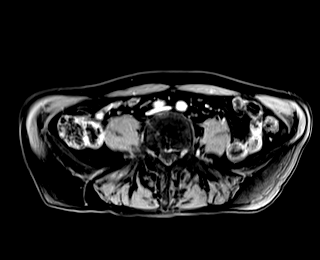
[im 44/88]
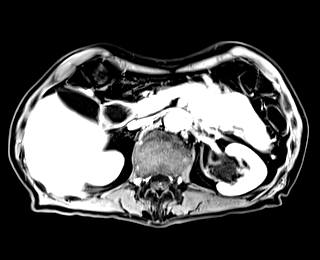
[im 88/88]
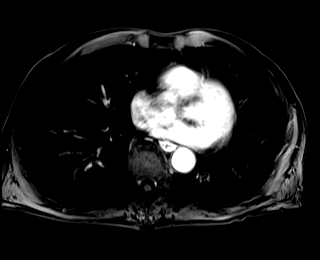

[Series 27: T1 dynamic fat-sat · axial · 3.0mm · 1.00mm/px · z∈[-207,+54]mm · 3 of 88 slices shown (5 of 5)]
[im 1/88]
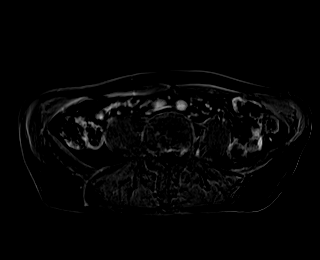
[im 44/88]
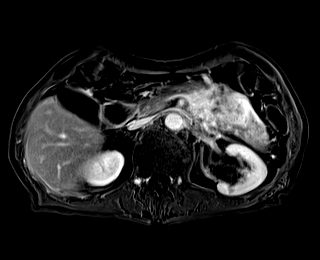
[im 88/88]
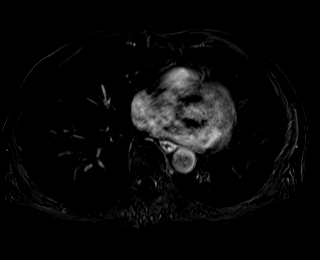

[44 of 48 positions shown; findings below may reference images not displayed]

FINDINGS: Lower chest: No acute findings.

Hepatobiliary: There are multiple subcentimeter, fluid signal,
nonenhancing lesions of the left and right lobes of the liver,
consistent with subcentimeter simple cysts, for example in the left
lobe, hepatic segment II (series 4, image 8) and in the posterior
liver dome, hepatic segment VII (series 4, image 10). No solid mass
or other parenchymal abnormality identified. No gallstones. No
biliary ductal dilatation.

Pancreas: Redemonstrated large, hypoenhancing mass of the pancreatic
body and tail, measuring 5.3 x 5.3 cm (series 19, image 39). This
appears to directly involve the adjacent greater curvature of the
stomach (series 19, image 26) as well as the duodenal jejunal
junction (series 3, image 15, series 4, image 19). This mass appears
to efface the underlying splenic vein and encase the mid splenic
artery. Dilatation of the pancreatic duct in the tip of the
pancreatic tail.

Spleen:  Within normal limits in size and appearance.

Adrenals/Urinary Tract: No masses identified. Numerous bilateral
parapelvic cysts. No hydronephrosis. No evidence of hydronephrosis.

Stomach/Bowel: Visualized portions within the abdomen are
unremarkable.

Vascular/Lymphatic: No pathologically enlarged lymph nodes
identified. No abdominal aortic aneurysm demonstrated.

Other:  None.

Musculoskeletal: No suspicious bone lesions identified.
IMPRESSION: 1. Redemonstrated large, hypoenhancing mass of the pancreatic body
and tail, measuring 5.3 x 5.3 cm and consistent with pancreatic
adenocarcinoma.
2. This appears to directly involve the adjacent greater curvature
of the stomach as well as the duodenal jejunal junction, and appears
to efface the underlying splenic vein and encase the mid splenic
artery.
3. Multiple subcentimeter liver lesions identified by prior CT are
simple cysts by MR. No suspicious liver lesions.
4. No evidence of lymphadenopathy or metastatic disease within the
abdomen.
5. Multiple subcentimeter simple hepatic cysts.
